# Patient Record
Sex: Male | Born: 1985 | Hispanic: No | Marital: Single | Smoking: Never smoker
Health system: Southern US, Community
[De-identification: ages and names within clinical notes are randomized; demographics above are authoritative.]

## PROBLEM LIST (undated history)

## (undated) DIAGNOSIS — R748 Abnormal levels of other serum enzymes: Secondary | ICD-10-CM

## (undated) HISTORY — PX: NO PAST SURGERIES: SHX2092

---

## 2017-01-02 ENCOUNTER — Emergency Department (HOSPITAL_COMMUNITY)
Admission: EM | Admit: 2017-01-02 | Discharge: 2017-01-02 | Disposition: A | Discharge status: Home / Self Care | Payer: Self-pay | Attending: Emergency Medicine | Admitting: Emergency Medicine

## 2017-01-02 ENCOUNTER — Encounter (HOSPITAL_COMMUNITY): Payer: Self-pay

## 2017-01-02 DIAGNOSIS — J0110 Acute frontal sinusitis, unspecified: Secondary | ICD-10-CM | POA: Insufficient documentation

## 2017-01-02 DIAGNOSIS — F17200 Nicotine dependence, unspecified, uncomplicated: Secondary | ICD-10-CM | POA: Insufficient documentation

## 2017-01-02 MED ORDER — BENZONATATE 100 MG PO CAPS: 200.0000 mg | ORAL_CAPSULE | Freq: Two times a day (BID) | ORAL | 0 refills | Status: DC | PRN

## 2017-01-02 MED ORDER — FLUTICASONE PROPIONATE 50 MCG/ACT NA SUSP: 1.0000 | Freq: Two times a day (BID) | NASAL | 2 refills | Status: DC

## 2017-01-02 NOTE — ED Triage Notes (Signed)
Pt states he has had sinus congestion X2 days. This morning he woke up with increased sinus pressure in his face. Resp e/u.

## 2017-01-02 NOTE — Discharge Instructions (Signed)
Take your medications as prescribed. Please follow up with a primary care provider from the Resource Guide provided below in 1 week as needed. Please return to the Emergency Department if symptoms worsen or new onset of fever, facial swelling, difficulty breathing, chest pain, coughing up blood, vomiting, neck stiffness, headache.

## 2017-01-02 NOTE — ED Provider Notes (Signed)
[place] DEPT Provider Note   CSN: [id number] Arrival date & time: 01/02/17  0745     History   Chief Complaint Chief Complaint  Patient presents with  . URI    HPI Glen Perry is a 31 y.o. male.  HPI   Patient is a 31 year old male with no pertinent past medical history presents the ED with complaint of nasal congestion. Patient reports over the past 2 days he has had gradually worsening nasal congestion with associated rhinorrhea, sinus pressure and nonproductive cough. Patient reports taking one dose of hydrocodone cough syrup without relief yesterday. Denies taking any other medications. Denies history of seasonal allergies. Denies fever, facial swelling, headache, visual changes, sore throat, hemoptysis, shortness of breath, chest pain, wheezing, abdominal pain, vomiting, neck pain/stiffness. Patient denies any known sick contacts.  History reviewed. No pertinent past medical history.  There are no active problems to display for this patient.   History reviewed. No pertinent surgical history.     Home Medications    Prior to Admission medications   Medication Sig Start Date End Date Taking? Authorizing Provider  benzonatate (TESSALON) 100 MG capsule Take 2 capsules (200 mg total) by mouth 2 (two) times daily as needed for cough. 01/02/17   Nona Dell, PA-C  fluticasone [facility]) 50 MCG/ACT nasal spray Place 1 spray into both nostrils 2 (two) times daily. 01/02/17   Nona Dell, PA-C    Family History History reviewed. No pertinent family history.  Social History Social History  Substance Use Topics  . Smoking status: Current Every Day Smoker    Packs/day: 0.50  . Smokeless tobacco: Never Used  . Alcohol use No     Allergies   Patient has no known allergies.   Review of Systems Review of Systems  HENT: Positive for congestion, rhinorrhea and sinus pressure.   Respiratory: Positive for cough.   All other systems  reviewed and are negative.    Physical Exam Updated Vital Signs BP 122/74 (BP Location: Right Arm)   Pulse 70   Temp 98 F (36.7 C) (Oral)   Resp 14   SpO2 97%   Physical Exam  Constitutional: He is oriented to person, place, and time. He appears well-developed and well-nourished. No distress.  HENT:  Head: Normocephalic and atraumatic.  Right Ear: Tympanic membrane normal.  Left Ear: Tympanic membrane normal.  Nose: Mucosal edema and rhinorrhea present. Right sinus exhibits frontal sinus tenderness. Right sinus exhibits no maxillary sinus tenderness. Left sinus exhibits frontal sinus tenderness. Left sinus exhibits no maxillary sinus tenderness.  Mouth/Throat: Uvula is midline, oropharynx is clear and moist and mucous membranes are normal. No oropharyngeal exudate, posterior oropharyngeal edema, posterior oropharyngeal erythema or tonsillar abscesses. No tonsillar exudate.  No facial or neck swelling  Eyes: Conjunctivae and EOM are normal. Pupils are equal, round, and reactive to light. Right eye exhibits no discharge. Left eye exhibits no discharge. No scleral icterus.  Neck: Normal range of motion. Neck supple.  Cardiovascular: Normal rate, regular rhythm, normal heart sounds and intact distal pulses.   Pulmonary/Chest: Effort normal and breath sounds normal. No respiratory distress. He has no wheezes. He has no rales. He exhibits no tenderness.  Abdominal: Soft. Bowel sounds are normal. He exhibits no distension. There is no tenderness.  Musculoskeletal: Normal range of motion. He exhibits no edema.  Lymphadenopathy:    He has no cervical adenopathy.  Neurological: He is alert and oriented to person, place, and time. He has normal strength. No  cranial nerve deficit or sensory deficit. Coordination and gait normal.  Skin: Skin is warm and dry. He is not diaphoretic.  Nursing note and vitals reviewed.    ED Treatments / Results  Labs (all labs ordered are listed, but only  abnormal results are displayed) Labs Reviewed - No data to display  EKG  EKG Interpretation None       Radiology No results found.  Procedures Procedures (including critical care time)  Medications Ordered in ED Medications - No data to display   Initial Impression / Assessment and Plan / ED Course  I have reviewed the triage vital signs and the nursing notes.  Pertinent labs & imaging results that were available during my care of the patient were reviewed by me and considered in my medical decision making (see chart for details).     Patient complaining of symptoms of sinusitis.  Mild symptoms of clear/yellow nasal discharge/congestion with nonproductive cough for less than 10 days.  Patient is afebrile.  No concern for acute bacterial rhinosinusitis; likely viral in nature.  Patient discharged with symptomatic treatment.  Patient instructions given for warm saline nasal washes.  Recommendations for follow-up with primary care physician.     Final Clinical Impressions(s) / ED Diagnoses   Final diagnoses:  Acute non-recurrent frontal sinusitis    New Prescriptions New Prescriptions   BENZONATATE (TESSALON) 100 MG CAPSULE    Take 2 capsules (200 mg total) by mouth 2 (two) times daily as needed for cough.   FLUTICASONE (FLONASE) 50 MCG/ACT NASAL SPRAY    Place 1 spray into both nostrils 2 (two) times daily.     Chesley Noon [place], [place] 01/02/17 4628    Veryl Speak, MD 01/03/17 2257

## 2017-02-23 ENCOUNTER — Emergency Department (HOSPITAL_COMMUNITY)
Admission: EM | Admit: 2017-02-23 | Discharge: 2017-02-23 | Disposition: A | Discharge status: Home / Self Care | Payer: Self-pay | Attending: Emergency Medicine | Admitting: Emergency Medicine

## 2017-02-23 ENCOUNTER — Encounter (HOSPITAL_COMMUNITY): Payer: Self-pay

## 2017-02-23 DIAGNOSIS — J3089 Other allergic rhinitis: Secondary | ICD-10-CM | POA: Insufficient documentation

## 2017-02-23 DIAGNOSIS — F17210 Nicotine dependence, cigarettes, uncomplicated: Secondary | ICD-10-CM | POA: Insufficient documentation

## 2017-02-23 DIAGNOSIS — J029 Acute pharyngitis, unspecified: Secondary | ICD-10-CM | POA: Insufficient documentation

## 2017-02-23 LAB — RAPID STREP SCREEN (MED CTR MEBANE ONLY): Streptococcus, Group A Screen (Direct): NEGATIVE

## 2017-02-23 MED ORDER — FLUTICASONE PROPIONATE 50 MCG/ACT NA SUSP: 2.0000 | Freq: Every day | NASAL | 0 refills | Status: DC

## 2017-02-23 MED ORDER — CETIRIZINE HCL 10 MG PO TABS: 10.0000 mg | ORAL_TABLET | Freq: Every day | ORAL | 1 refills | Status: DC

## 2017-02-23 MED ORDER — IBUPROFEN 400 MG PO TABS: 400.0000 mg | ORAL_TABLET | Freq: Four times a day (QID) | ORAL | 0 refills | Status: DC | PRN

## 2017-02-23 MED ORDER — IBUPROFEN 800 MG PO TABS
800.0000 mg | ORAL_TABLET | Freq: Once | ORAL | Status: AC
  Administered 2017-02-23: 800 mg via ORAL
  Filled 2017-02-23: qty 1

## 2017-02-23 NOTE — ED Triage Notes (Signed)
Patient complains of recurrent tonsillitis. Just finished amoxicillin 7 days ago for same. NAD

## 2017-02-23 NOTE — ED Provider Notes (Signed)
[place] DEPT Provider Note   CSN: [id number] Arrival date & time: 02/23/17  8315  By signing my name below, I, Marcello Moores, attest that this documentation has been prepared under the direction and in the presence of Waynetta Pean, PA-C. Electronically Signed: Marcello Moores, ED Scribe. 02/23/17. 9:50 AM.  History   Chief Complaint No chief complaint on file.  The history is provided by the patient. No language interpreter was used.   HPI Comments: Glen Perry is a 31 y.o. male who presents to the Emergency Department complaining of mild, constant sore throat with associated fatigue due to poassible recurrent tonsillitis that began 5-7 days ago. He states that he completed antibiotics (amoxicillin) 8-9 days ago (although not recorded in his chart). Pt states when he was last seen by [facility], they did not conduct a strep test. The pt denies fever, trouble swallowing, neck pain, rashes, post nasal drip, nasal congestion, rhinorrhea, and cough.   History reviewed. No pertinent past medical history.  There are no active problems to display for this patient.   History reviewed. No pertinent surgical history.     Home Medications    Prior to Admission medications   Medication Sig Start Date End Date Taking? Authorizing Provider  benzonatate (TESSALON) 100 MG capsule Take 2 capsules (200 mg total) by mouth 2 (two) times daily as needed for cough. 01/02/17   Nona Dell, PA-C  cetirizine (ZYRTEC ALLERGY) 10 MG tablet Take 1 tablet (10 mg total) by mouth daily. 02/23/17   Waynetta Pean, PA-C  fluticasone (FLONASE) 50 MCG/ACT nasal spray Place 2 sprays into both nostrils daily. 02/23/17   Waynetta Pean, PA-C  ibuprofen (ADVIL,MOTRIN) 400 MG tablet Take 1 tablet (400 mg total) by mouth every 6 (six) hours as needed. 02/23/17   Waynetta Pean, PA-C    Family History No family history on file.  Social History Social History  Substance Use  Topics  . Smoking status: Current Every Day Smoker    Packs/day: 0.50  . Smokeless tobacco: Never Used  . Alcohol use No     Allergies   Patient has no known allergies.   Review of Systems Review of Systems  Constitutional: Positive for fatigue. Negative for chills and fever.  HENT: Positive for sore throat. Negative for congestion, mouth sores, postnasal drip, trouble swallowing and voice change.   Eyes: Negative for visual disturbance.  Respiratory: Negative for cough and shortness of breath.   Gastrointestinal: Negative for abdominal pain and vomiting.  Musculoskeletal: Negative for neck pain.  Skin: Negative for rash.     Physical Exam Updated Vital Signs BP 108/67   Pulse 67   Temp 98.4 F (36.9 C) (Oral)   Resp 18   SpO2 97%   Physical Exam  Constitutional: He appears well-developed and well-nourished. No distress.  Nontoxic appearing.  HENT:  Head: Normocephalic and atraumatic.  Right Ear: External ear normal.  Left Ear: External ear normal.  Mouth/Throat: No oropharyngeal exudate.  Boggy nasal turbinates bilaterally with rhinorrhea present. Mild bilateral tonsillar hypertrophy without exudate. Uvula is midline without edema. No peritonsillar abscess. No trismus. No drooling.   Eyes: Conjunctivae are normal. Pupils are equal, round, and reactive to light. Right eye exhibits no discharge. Left eye exhibits no discharge.  Neck: Normal range of motion. Neck supple. No JVD present. No tracheal deviation present.  Cardiovascular: Normal rate, regular rhythm, normal heart sounds and intact distal pulses.   Pulmonary/Chest: Effort normal and breath sounds normal. No stridor.  No respiratory distress. He has no wheezes. He has no rales.  Abdominal: Soft. There is no tenderness.  Lymphadenopathy:    He has no cervical adenopathy.  Neurological: He is alert. Coordination normal.  Skin: Skin is warm and dry. Capillary refill takes less than 2 seconds. No rash noted. He  is not diaphoretic. No erythema. No pallor.  Psychiatric: He has a normal mood and affect. His behavior is normal.  Nursing note and vitals reviewed.    ED Treatments / Results   DIAGNOSTIC STUDIES: Oxygen Saturation is 97% on RA, adequate by my interpretation.   COORDINATION OF CARE: 9:46 AM-Discussed next steps with pt. Pt verbalized understanding and is agreeable with the plan.   Labs (all labs ordered are listed, but only abnormal results are displayed) Labs Reviewed  RAPID STREP SCREEN (NOT AT [facility])  CULTURE, GROUP A STREP [facility])    EKG  EKG Interpretation None       Radiology No results found.  Procedures Procedures (including critical care time)  Medications Ordered in ED Medications  ibuprofen (ADVIL,MOTRIN) tablet 800 mg (800 mg Oral Given 02/23/17 0956)     Initial Impression / Assessment and Plan / ED Course  I have reviewed the triage vital signs and the nursing notes.  Pertinent labs & imaging results that were available during my care of the patient were reviewed by me and considered in my medical decision making (see chart for details).     This  is a 31 y.o. male who presents to the Emergency Department complaining of mild, constant sore throat with associated fatigue due to poassible recurrent tonsillitis that began 5-7 days ago. He states that he completed antibiotics (amoxicillin) 8-9 days ago (although not recorded in his chart). Pt states when he was last seen by [facility], they did not conduct a strep test. The pt denies fever, trouble swallowing.  On exam the patient is afebrile nontoxic appearing. He has mild bilateral tonsillar hypertrophy without exudates. Uvula is midline without edema. Rhinorrhea and boggy nasal terminates are present bilaterally. Rapid strep is negative. Patient with viral sore throat and allergic rhinitis. Will start on Flonase, Zyrtec and ibuprofen. Encouraged lots of clear fluids. I encouraged close follow-up by  primary care. I discussed return precautions. I advised the patient to follow-up with their primary care provider this week. I advised the patient to return to the emergency department with new or worsening symptoms or new concerns. The patient verbalized understanding and agreement with plan.      Final Clinical Impressions(s) / ED Diagnoses   Final diagnoses:  Viral pharyngitis  Seasonal allergic rhinitis due to other allergic trigger    New Prescriptions New Prescriptions   CETIRIZINE (ZYRTEC ALLERGY) 10 MG TABLET    Take 1 tablet (10 mg total) by mouth daily.   FLUTICASONE (FLONASE) 50 MCG/ACT NASAL SPRAY    Place 2 sprays into both nostrils daily.   IBUPROFEN (ADVIL,MOTRIN) 400 MG TABLET    Take 1 tablet (400 mg total) by mouth every 6 (six) hours as needed.   I personally performed the services described in this documentation, which was scribed in my presence. The recorded information has been reviewed and is accurate.       Waynetta Pean, PA-C 02/23/17 1025    Tanna Furry, MD 02/27/17 [phone]

## 2017-02-25 LAB — CULTURE, GROUP A STREP (THRC)

## 2017-04-22 ENCOUNTER — Emergency Department (HOSPITAL_COMMUNITY)
Admission: EM | Admit: 2017-04-22 | Discharge: 2017-04-22 | Disposition: A | Discharge status: Home / Self Care | Payer: Self-pay | Attending: Emergency Medicine | Admitting: Emergency Medicine

## 2017-04-22 ENCOUNTER — Emergency Department (HOSPITAL_COMMUNITY): Payer: Self-pay

## 2017-04-22 ENCOUNTER — Encounter (HOSPITAL_COMMUNITY): Payer: Self-pay | Admitting: Emergency Medicine

## 2017-04-22 DIAGNOSIS — K4020 Bilateral inguinal hernia, without obstruction or gangrene, not specified as recurrent: Secondary | ICD-10-CM | POA: Insufficient documentation

## 2017-04-22 DIAGNOSIS — K4021 Bilateral inguinal hernia, without obstruction or gangrene, recurrent: Secondary | ICD-10-CM

## 2017-04-22 DIAGNOSIS — I499 Cardiac arrhythmia, unspecified: Secondary | ICD-10-CM | POA: Insufficient documentation

## 2017-04-22 DIAGNOSIS — F17200 Nicotine dependence, unspecified, uncomplicated: Secondary | ICD-10-CM | POA: Insufficient documentation

## 2017-04-22 LAB — URINALYSIS, ROUTINE W REFLEX MICROSCOPIC
BILIRUBIN URINE: NEGATIVE
Glucose, UA: NEGATIVE mg/dL
Hgb urine dipstick: NEGATIVE
KETONES UR: NEGATIVE mg/dL
Leukocytes, UA: NEGATIVE
NITRITE: NEGATIVE
PROTEIN: NEGATIVE mg/dL
Specific Gravity, Urine: 1.006 (ref 1.005–1.030)
pH: 8 (ref 5.0–8.0)

## 2017-04-22 LAB — CBC
HCT: 46.5 % (ref 39.0–52.0)
HEMOGLOBIN: 15.7 g/dL (ref 13.0–17.0)
MCH: 28.9 pg (ref 26.0–34.0)
MCHC: 33.8 g/dL (ref 30.0–36.0)
MCV: 85.5 fL (ref 78.0–100.0)
Platelets: 135 10*3/uL — ABNORMAL LOW (ref 150–400)
RBC: 5.44 MIL/uL (ref 4.22–5.81)
RDW: 12.7 % (ref 11.5–15.5)
WBC: 6 10*3/uL (ref 4.0–10.5)

## 2017-04-22 LAB — BASIC METABOLIC PANEL
ANION GAP: 12 (ref 5–15)
BUN: 8 mg/dL (ref 6–20)
CALCIUM: 8.9 mg/dL (ref 8.9–10.3)
CO2: 20 mmol/L — AB (ref 22–32)
Chloride: 104 mmol/L (ref 101–111)
Creatinine, Ser: 1.26 mg/dL — ABNORMAL HIGH (ref 0.61–1.24)
GFR calc Af Amer: >60 mL/min (ref >60)
Glucose, Bld: 106 mg/dL — ABNORMAL HIGH (ref 65–99)
POTASSIUM: 4.5 mmol/L (ref 3.5–5.1)
Sodium: 136 mmol/L (ref 135–145)

## 2017-04-22 LAB — LIPASE, BLOOD: LIPASE: 29 U/L (ref 11–51)

## 2017-04-22 LAB — I-STAT CG4 LACTIC ACID, ED
LACTIC ACID, VENOUS: 1.05 mmol/L (ref 0.5–1.9)
LACTIC ACID, VENOUS: 0.95 mmol/L (ref 0.5–1.9)

## 2017-04-22 LAB — I-STAT TROPONIN, ED: TROPONIN I, POC: 0 ng/mL (ref 0.00–0.08)

## 2017-04-22 MED ORDER — IOPAMIDOL (ISOVUE-300) INJECTION 61%
INTRAVENOUS | Status: AC
  Administered 2017-04-22: 100 mL
  Filled 2017-04-22: qty 100

## 2017-04-22 MED ORDER — SODIUM CHLORIDE 0.9 % IV BOLUS (SEPSIS)
1000.0000 mL | Freq: Once | INTRAVENOUS | Status: AC
Start: 2017-04-22 — End: 2017-04-22
  Administered 2017-04-22: 1000 mL via INTRAVENOUS

## 2017-04-22 NOTE — ED Notes (Signed)
Patient transported to CT 

## 2017-04-22 NOTE — ED Triage Notes (Addendum)
Pt reports 4 days ago he began having centralized cramping to middle of abdomen. Pt also states he uses an app on his phone and noticed his heart rate was 105. Pt states he will be lying down and feel his heart pounding in his chest. PT states it was 106 while donating plasma. Denies any nausea vomiting and diarrhea with the abdominal cramping. Pt also reports trouble sleeping, only sleeping 2-3 hours at a time. Also reports headaches. Denies fevers, shortness of breath or chest pain.Pt states his heart rate is normally 60.

## 2017-04-22 NOTE — Discharge Instructions (Signed)
Take tylenol, motrin for pain.   You have hernias so you should call surgery for appointment for treatment options  Stay hydrated   Return to ER if you have fever, vomiting, severe abdominal pain or distention, severe pain at the hernia site or hernia not pushing back in

## 2017-04-22 NOTE — ED Notes (Signed)
Pt called to be roomed with no answer.  

## 2017-04-22 NOTE — ED Notes (Signed)
ED Provider at bedside. 

## 2017-04-22 NOTE — ED Provider Notes (Signed)
[place] DEPT Provider Note   CSN: [id number] Arrival date & time: 04/22/17  0316     History   Chief Complaint Chief Complaint  Patient presents with  . Irregular Heart Beat    HPI Glen Perry is a 31 y.o. male otherwise healthy here with headaches, abdominal pain. Patient has been having intermittent headaches for the last 4 days. No associated vomiting or blurry vision or weakness. Went to donate plasma several days ago and was noted to be tachycardic to 105 so didn't donate plasma. Over the last several days as well, patient has been having umbilical pain that is causing him to be nauseated but denies any vomiting. The pain has not gone away and denies any urinary symptoms. Patient states that he is a little constipated and last bowel movement was yesterday he tried MiraLAX with no relief. Denies any previous abdominal surgeries.   The history is provided by the patient.    History reviewed. No pertinent past medical history.  There are no active problems to display for this patient.   No past surgical history on file.     Home Medications    Prior to Admission medications   Medication Sig Start Date End Date Taking? Authorizing Provider  benzonatate (TESSALON) 100 MG capsule Take 2 capsules (200 mg total) by mouth 2 (two) times daily as needed for cough. Patient not taking: Reported on 04/22/2017 01/02/17   Nona Dell, PA-C  cetirizine (ZYRTEC ALLERGY) 10 MG tablet Take 1 tablet (10 mg total) by mouth daily. Patient not taking: Reported on 04/22/2017 02/23/17   Waynetta Pean, PA-C  fluticasone [facility]) 50 MCG/ACT nasal spray Place 2 sprays into both nostrils daily. Patient not taking: Reported on 04/22/2017 02/23/17   Waynetta Pean, PA-C  ibuprofen (ADVIL,MOTRIN) 400 MG tablet Take 1 tablet (400 mg total) by mouth every 6 (six) hours as needed. Patient not taking: Reported on 04/22/2017 02/23/17   Waynetta Pean, PA-C    Family  History No family history on file.  Social History Social History  Substance Use Topics  . Smoking status: Current Every Day Smoker    Packs/day: 0.50  . Smokeless tobacco: Never Used  . Alcohol use No     Allergies   Patient has no known allergies.   Review of Systems Review of Systems  Gastrointestinal: Positive for abdominal pain.  Neurological: Positive for headaches.  All other systems reviewed and are negative.    Physical Exam Updated Vital Signs BP 109/68 (BP Location: Left Arm)   Pulse 67   Temp 99.4 F (37.4 C) (Oral)   Resp 20   SpO2 98%   Physical Exam  Constitutional: He is oriented to person, place, and time.  Slightly uncomfortable   HENT:  Head: Normocephalic.  Eyes: Pupils are equal, round, and reactive to light. Conjunctivae and EOM are normal.  Neck: Normal range of motion. Neck supple.  Cardiovascular: Normal rate, regular rhythm and normal heart sounds.   Pulmonary/Chest: Effort normal and breath sounds normal. No respiratory distress. He has no wheezes.  Abdominal: Soft. Bowel sounds are normal.  Mild periumbilical tenderness, no RLQ or LLQ tenderness   Musculoskeletal: Normal range of motion.  Neurological: He is alert and oriented to person, place, and time. He displays normal reflexes. No cranial nerve deficit. Coordination normal.  CN 2-12 intact. Nl strength throughout. Nl sensation throughout   Skin: Skin is warm.  Psychiatric: He has a normal mood and affect.  Nursing note and vitals reviewed.  ED Treatments / Results  Labs (all labs ordered are listed, but only abnormal results are displayed) Labs Reviewed  BASIC METABOLIC PANEL - Abnormal; Notable for the following:       Result Value   CO2 20 (*)    Glucose, Bld 106 (*)    Creatinine, Ser 1.26 (*)    All other components within normal limits  CBC - Abnormal; Notable for the following:    Platelets 135 (*)    All other components within normal limits  LIPASE, BLOOD   URINALYSIS, ROUTINE W REFLEX MICROSCOPIC  I-STAT TROPONIN, ED  I-STAT CG4 LACTIC ACID, ED  I-STAT CG4 LACTIC ACID, ED    EKG  EKG Interpretation  Date/Time:  Saturday April 22 2017 03:16:40 EDT Ventricular Rate:  90 PR Interval:  144 QRS Duration: 88 QT Interval:  356 QTC Calculation: 435 R Axis:   99 Text Interpretation:  Normal sinus rhythm Rightward axis Borderline ECG No previous ECGs available Confirmed by Wandra Arthurs [phone]) on 04/22/2017 7:05:54 AM       Radiology Dg Chest 2 View  Result Date: 04/22/2017 CLINICAL DATA:  Acute onset of central abdominal cramping and tachycardia. Headaches. Initial encounter. EXAM: CHEST  2 VIEW COMPARISON:  None. FINDINGS: The lungs are well-aerated and clear. There is no evidence of focal opacification, pleural effusion or pneumothorax. The heart is normal in size; the mediastinal contour is within normal limits. No acute osseous abnormalities are seen. IMPRESSION: No acute cardiopulmonary process seen. Electronically Signed   By: Garald Balding M.D.   On: 04/22/2017 03:44   Ct Abdomen Pelvis W Contrast  Result Date: 04/22/2017 CLINICAL DATA:  Epigastric pain for several days EXAM: CT ABDOMEN AND PELVIS WITH CONTRAST TECHNIQUE: Multidetector CT imaging of the abdomen and pelvis was performed using the standard protocol following bolus administration of intravenous contrast. CONTRAST:  123mL ISOVUE-300 IOPAMIDOL (ISOVUE-300) INJECTION 61% COMPARISON:  None. FINDINGS: Lower chest: No acute abnormality. Hepatobiliary: No focal liver abnormality is seen. No gallstones, gallbladder wall thickening, or biliary dilatation. Pancreas: Unremarkable. No pancreatic ductal dilatation or surrounding inflammatory changes. Spleen: Normal in size without focal abnormality. Adrenals/Urinary Tract: Bladder is well distended. Kidneys are well visualized bilaterally. No renal calculi or obstructive changes are seen. Stomach/Bowel: Stomach is within normal limits.  Appendix appears normal. No evidence of bowel wall thickening, distention, or inflammatory changes. Vascular/Lymphatic: No significant vascular findings are present. No enlarged abdominal or pelvic lymph nodes. Reproductive: Prostate is unremarkable. Other: Small fluid containing right inguinal hernia is noted. Small fat containing left inguinal hernia is seen. Musculoskeletal: No acute or significant osseous findings. IMPRESSION: Bilateral small inguinal hernias. Fluid is noted within the right inguinal hernia. No other focal abnormality is seen. Electronically Signed   By: Inez Catalina M.D.   On: 04/22/2017 09:29    Procedures Procedures (including critical care time)  Medications Ordered in ED Medications  sodium chloride 0.9 % bolus 1,000 mL (1,000 mLs Intravenous New Bag/Given 04/22/17 0736)  iopamidol (ISOVUE-300) 61 % injection (100 mLs  Contrast Given 04/22/17 0856)     Initial Impression / Assessment and Plan / ED Course  I have reviewed the triage vital signs and the nursing notes.  Pertinent labs & imaging results that were available during my care of the patient were reviewed by me and considered in my medical decision making (see chart for details).     Glen Perry is a 31 y.o. male here with abdominal pain, dizziness, headaches. Nl neuro exam,  headaches improved. Still has mild periumbilical tenderness. Likely mild gastro vs appendicitis. HR down to 80s in the ED. No chest pain or SOB to suggest PE. Will get labs, CT ab/pel, UA. Will hydrate and reassess.   9:41 AM  Bicarb 20, likely from dehydration. CT ab/pel showed bilateral inguinal hernia. He states that he has hx of this and I palpated the hernias and they are reducible and soft. No signs of SBO, nl appendix. More likely gastro and inguinal hernias incidental. Will dc home with motrin, tylenol, surgery follow up outpatient.    Final Clinical Impressions(s) / ED Diagnoses   Final diagnoses:  None    New  Prescriptions New Prescriptions   No medications on file     Drenda Freeze, MD 04/22/17 [phone]

## 2017-05-17 ENCOUNTER — Emergency Department (HOSPITAL_COMMUNITY)
Admission: EM | Admit: 2017-05-17 | Discharge: 2017-05-17 | Disposition: A | Discharge status: Home / Self Care | Payer: Self-pay | Attending: Emergency Medicine | Admitting: Emergency Medicine

## 2017-05-17 ENCOUNTER — Encounter (HOSPITAL_COMMUNITY): Payer: Self-pay | Admitting: *Deleted

## 2017-05-17 DIAGNOSIS — M542 Cervicalgia: Secondary | ICD-10-CM | POA: Insufficient documentation

## 2017-05-17 DIAGNOSIS — F17200 Nicotine dependence, unspecified, uncomplicated: Secondary | ICD-10-CM | POA: Insufficient documentation

## 2017-05-17 MED ORDER — CYCLOBENZAPRINE HCL 10 MG PO TABS: 10.0000 mg | ORAL_TABLET | Freq: Two times a day (BID) | ORAL | 0 refills | Status: DC | PRN

## 2017-05-17 MED ORDER — IBUPROFEN 600 MG PO TABS: 600.0000 mg | ORAL_TABLET | Freq: Four times a day (QID) | ORAL | 0 refills | Status: DC | PRN

## 2017-05-17 NOTE — Discharge Instructions (Signed)
Take Ibuprofen three times daily for the next week. Take this medicine with food. Take muscle relaxer at bedtime to help you sleep. This medicine makes you drowsy so do not take before driving or work Use a heating pad for sore muscles - use for 20 minutes several times a day Return for worsening symptoms

## 2017-05-17 NOTE — ED Provider Notes (Signed)
[facility] DEPT Provider Note   CSN: [id number] Arrival date & time: 05/17/17  1048     History   Chief Complaint Chief Complaint  Patient presents with  . Neck Pain    HPI Glen Perry is a 31 y.o. male who presents with right-sided neck pain. About 3 weeks ago he rode a roller coaster and immediately afterwards felt pain over the right side of his neck. It is constant and worse with certain movements. It is worse in the morning and feels stiff when he wakes up. He has been taking ibuprofen with minimal relief. He denies any arm or leg weakness, numbness or tingling. Bowel or bladder incontinence. He states that he has chronic pain mid back pain from scoliosis but does not typically have neck pain.  HPI  History reviewed. No pertinent past medical history.  There are no active problems to display for this patient.   History reviewed. No pertinent surgical history.     Home Medications    Prior to Admission medications   Medication Sig Start Date End Date Taking? Authorizing Provider  benzonatate (TESSALON) 100 MG capsule Take 2 capsules (200 mg total) by mouth 2 (two) times daily as needed for cough. Patient not taking: Reported on 04/22/2017 01/02/17   Nona Dell, PA-C  cetirizine (ZYRTEC ALLERGY) 10 MG tablet Take 1 tablet (10 mg total) by mouth daily. Patient not taking: Reported on 04/22/2017 02/23/17   Waynetta Pean, PA-C  fluticasone [facility]) 50 MCG/ACT nasal spray Place 2 sprays into both nostrils daily. Patient not taking: Reported on 04/22/2017 02/23/17   Waynetta Pean, PA-C  ibuprofen (ADVIL,MOTRIN) 400 MG tablet Take 1 tablet (400 mg total) by mouth every 6 (six) hours as needed. Patient not taking: Reported on 04/22/2017 02/23/17   Waynetta Pean, PA-C    Family History History reviewed. No pertinent family history.  Social History Social History  Substance Use Topics  . Smoking status: Current Every Day Smoker    Packs/day:  0.50  . Smokeless tobacco: Never Used  . Alcohol use No     Allergies   Patient has no known allergies.   Review of Systems Review of Systems  Musculoskeletal: Positive for myalgias, neck pain and neck stiffness. Negative for back pain and gait problem.  Neurological: Negative for weakness and numbness.     Physical Exam Updated Vital Signs BP 111/79 (BP Location: Left Arm)   Pulse (!) 50   Temp 97.8 F (36.6 C) (Oral)   Resp 18   SpO2 100%   Physical Exam  Constitutional: He is oriented to person, place, and time. He appears well-developed and well-nourished. No distress.  HENT:  Head: Normocephalic and atraumatic.  Eyes: Pupils are equal, round, and reactive to light. Conjunctivae are normal. Right eye exhibits no discharge. Left eye exhibits no discharge. No scleral icterus.  Neck: Normal range of motion. Neck supple.  No midline tenderness. Tenderness over the right side of the neck and trapezius  Cardiovascular: Normal rate.   Pulmonary/Chest: Effort normal. No respiratory distress.  Abdominal: He exhibits no distension.  Neurological: He is alert and oriented to person, place, and time.  GCS 15. Speaks in a clear voice. Cranial nerves II through XII grossly intact. 5/5 strength in all extremities. Sensation fully intact.  Bilateral finger-to-nose intact. Ambulatory    Skin: Skin is warm and dry.  Psychiatric: He has a normal mood and affect. His behavior is normal.  Nursing note and vitals reviewed.    ED  Treatments / Results  Labs (all labs ordered are listed, but only abnormal results are displayed) Labs Reviewed - No data to display  EKG  EKG Interpretation None       Radiology No results found.  Procedures Procedures (including critical care time)  Medications Ordered in ED Medications - No data to display   Initial Impression / Assessment and Plan / ED Course  I have reviewed the triage vital signs and the nursing notes.  Pertinent labs  & imaging results that were available during my care of the patient were reviewed by me and considered in my medical decision making (see chart for details).  31 year old male presents with right sided neck pain. Suspect ongoing strain. He has no midline tenderness and normal strength. Will treat conservatively with NSAIDs, muscle relaxer, heat and advised return for worsening symptoms.  Final Clinical Impressions(s) / ED Diagnoses   Final diagnoses:  Neck pain    New Prescriptions New Prescriptions   No medications on file     Iris Pert 05/17/17 1406    Nat Christen, MD 05/20/17 [phone]

## 2017-05-17 NOTE — ED Triage Notes (Signed)
Pt reports having neck pain since riding a roller coaster 3 weeks ago. Ambulatory at triage and no acute distress noted.

## 2017-05-17 NOTE — ED Notes (Signed)
See EDP secondary assessment.  

## 2018-09-17 ENCOUNTER — Emergency Department (HOSPITAL_COMMUNITY)
Admission: EM | Admit: 2018-09-17 | Discharge: 2018-09-17 | Disposition: A | Discharge status: Home / Self Care | Payer: Self-pay | Attending: Emergency Medicine | Admitting: Emergency Medicine

## 2018-09-17 ENCOUNTER — Encounter (HOSPITAL_COMMUNITY): Payer: Self-pay | Admitting: *Deleted

## 2018-09-17 DIAGNOSIS — J069 Acute upper respiratory infection, unspecified: Secondary | ICD-10-CM | POA: Insufficient documentation

## 2018-09-17 DIAGNOSIS — F17210 Nicotine dependence, cigarettes, uncomplicated: Secondary | ICD-10-CM | POA: Insufficient documentation

## 2018-09-17 DIAGNOSIS — B9789 Other viral agents as the cause of diseases classified elsewhere: Secondary | ICD-10-CM

## 2018-09-17 MED ORDER — DM-GUAIFENESIN ER 60-1200 MG PO TB12: 1.0000 | ORAL_TABLET | Freq: Two times a day (BID) | ORAL | 0 refills | Status: AC

## 2018-09-17 MED ORDER — FLUTICASONE PROPIONATE 50 MCG/ACT NA SUSP: 1.0000 | Freq: Every day | NASAL | 1 refills | Status: DC

## 2018-09-17 NOTE — Discharge Instructions (Signed)
Please read and follow all provided instructions.  Your diagnoses today include:  1. Viral URI with cough     You appear to have an upper respiratory infection (URI). An upper respiratory tract infection, or cold, is a viral infection of the air passages leading to the lungs. It should improve gradually after 5-7 days. You may have a lingering cough that lasts for 2- 4 weeks after the infection.  Tests performed today include: Vital signs. See below for your results today.   Medications prescribed:   Take any prescribed medications only as directed. Treatment for your infection is aimed at treating the symptoms. There are no medications, such as antibiotics, that will cure your infection.   Home care instructions:  Follow any educational materials contained in this packet.   Your illness is contagious and can be spread to others, especially during the first 3 or 4 days. It cannot be cured by antibiotics or other medicines. Take basic precautions such as washing your hands often, covering your mouth when you cough or sneeze, and avoiding public places where you could spread your illness to others.   Please continue drinking plenty of fluids.  Use over-the-counter medicines as needed as directed on packaging for symptom relief.  You may also use ibuprofen or tylenol as directed on packaging for pain or fever.  Do not take multiple medicines containing Tylenol or acetaminophen to avoid taking too much of this medication.  Follow-up instructions: Please follow-up with your primary care provider in the next 3 days for further evaluation of your symptoms if you are not feeling better.   Return instructions:  Please return to the Emergency Department if you experience worsening symptoms.  RETURN IMMEDIATELY IF you develop shortness of breath, chest pain, confusion or altered mental status, a new rash, become dizzy, faint, or poorly responsive, or are unable to be cared for at home. Please return  if you have persistent vomiting and cannot keep down fluids or develop a fever that is not controlled by tylenol or motrin.   Please return if you have any other emergent concerns.  Additional Information:  Your vital signs today were: BP (!) 125/94 (BP Location: Right Arm)    Pulse (!) 51    Temp 98 F (36.7 C) (Oral)    Resp 18    SpO2 98%  If your blood pressure (BP) was elevated above 135/85 this visit, please have this repeated by your doctor within one month. --------------

## 2018-09-17 NOTE — ED Triage Notes (Signed)
Pt in c/o cough and congestion for the last week, sore throat as well, denies fever, no distress noted

## 2018-09-17 NOTE — ED Provider Notes (Signed)
[facility] EMERGENCY DEPARTMENT Provider Note   CSN: [id number] Arrival date & time: 09/17/18  1019     History   Chief Complaint Chief Complaint  Patient presents with  . URI    HPI Glen Perry is a 33 y.o. male.  HPI  Patient is a 33 year old male with no significant past medical history presenting for cough, nasal congestion, and rhinorrhea.  Patient reports that he missed 2 days of work due to this and needs a work note.  Patient reports that his symptoms are improving, however he continues to have a sore throat in the mornings, rhinorrhea in the mornings, as well as a nonproductive cough.  He denies any fever or chills over the interval of this illness over the past week.  Patient denies any otalgia.  Patient denies any history of asthma or immune compromise status.  History reviewed. No pertinent past medical history.  There are no active problems to display for this patient.   History reviewed. No pertinent surgical history.      Home Medications    Prior to Admission medications   Medication Sig Start Date End Date Taking? Authorizing Provider  cyclobenzaprine (FLEXERIL) 10 MG tablet Take 1 tablet (10 mg total) by mouth 2 (two) times daily as needed for muscle spasms. 05/17/17   Recardo Evangelist, PA-C  ibuprofen (ADVIL,MOTRIN) 600 MG tablet Take 1 tablet (600 mg total) by mouth every 6 (six) hours as needed. 05/17/17   Recardo Evangelist, PA-C    Family History History reviewed. No pertinent family history.  Social History Social History   Tobacco Use  . Smoking status: Current Every Day Smoker    Packs/day: 0.50  . Smokeless tobacco: Never Used  Substance Use Topics  . Alcohol use: No  . Drug use: Not on file     Allergies   Patient has no known allergies.   Review of Systems Review of Systems  Constitutional: Negative for chills and fever.  HENT: Positive for congestion, postnasal drip and rhinorrhea. Negative for  ear pain and sore throat.   Respiratory: Positive for cough.   Gastrointestinal: Negative for diarrhea, nausea and vomiting.     Physical Exam Updated Vital Signs BP (!) 125/94 (BP Location: Right Arm)   Pulse (!) 51   Temp 98 F (36.7 C) (Oral)   Resp 18   SpO2 98%   Physical Exam Vitals signs and nursing note reviewed.  Constitutional:      General: He is not in acute distress.    Appearance: He is well-developed. He is not diaphoretic.     Comments: Sitting comfortably in bed.  HENT:     Head: Normocephalic and atraumatic.     Mouth/Throat:     Mouth: Mucous membranes are moist.     Pharynx: No oropharyngeal exudate or posterior oropharyngeal erythema.  Eyes:     General:        Right eye: No discharge.        Left eye: No discharge.     Conjunctiva/sclera: Conjunctivae normal.     Comments: EOMs normal to gross examination.  Neck:     Musculoskeletal: Normal range of motion.  Cardiovascular:     Rate and Rhythm: Regular rhythm.     Heart sounds: Normal heart sounds. No murmur.     Comments: Bradycardia noted.  Pulmonary:     Effort: Pulmonary effort is normal.     Breath sounds: Normal breath sounds. No wheezing, rhonchi or  rales.  Abdominal:     General: There is no distension.  Musculoskeletal: Normal range of motion.  Skin:    General: Skin is warm and dry.  Neurological:     Mental Status: He is alert.     Comments: Cranial nerves intact to gross observation. Patient moves extremities without difficulty.  Psychiatric:        Behavior: Behavior normal.        Thought Content: Thought content normal.        Judgment: Judgment normal.      ED Treatments / Results  Labs (all labs ordered are listed, but only abnormal results are displayed) Labs Reviewed - No data to display  EKG None  Radiology No results found.  Procedures Procedures (including critical care time)  Medications Ordered in ED Medications - No data to display   Initial  Impression / Assessment and Plan / ED Course  I have reviewed the triage vital signs and the nursing notes.  Pertinent labs & imaging results that were available during my care of the patient were reviewed by me and considered in my medical decision making (see chart for details).     Patient nontoxic-appearing, afebrile, no acute distress.  Patient with symptoms consistent with a viral syndrome. Vitals are stable, no fever. No signs of dehydration. Lung exam normal, no signs of pneumonia. Supportive therapy indicated with return if symptoms worsen.    Pulse was noted to be 51.  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 Asymptomatic.   Final Clinical Impressions(s) / ED Diagnoses   Final diagnoses:  Viral URI with cough    ED Discharge Orders         Ordered    fluticasone (FLONASE) 50 MCG/ACT nasal spray  Daily     09/17/18 1152    Dextromethorphan-Guaifenesin 60-1200 MG 12hr tablet  Every 12 hours     09/17/18 1152           Albesa Seen, [place] 09/17/18 1153    Maudie Flakes, MD 09/17/18 1502

## 2018-11-08 ENCOUNTER — Emergency Department (HOSPITAL_COMMUNITY)
Admission: EM | Admit: 2018-11-08 | Discharge: 2018-11-08 | Disposition: A | Discharge status: Home / Self Care | Payer: Self-pay | Attending: Emergency Medicine | Admitting: Emergency Medicine

## 2018-11-08 ENCOUNTER — Emergency Department (HOSPITAL_COMMUNITY): Payer: Self-pay

## 2018-11-08 ENCOUNTER — Encounter (HOSPITAL_COMMUNITY): Payer: Self-pay | Admitting: Emergency Medicine

## 2018-11-08 DIAGNOSIS — S6991XA Unspecified injury of right wrist, hand and finger(s), initial encounter: Secondary | ICD-10-CM | POA: Insufficient documentation

## 2018-11-08 DIAGNOSIS — W208XXA Other cause of strike by thrown, projected or falling object, initial encounter: Secondary | ICD-10-CM | POA: Insufficient documentation

## 2018-11-08 DIAGNOSIS — Z87891 Personal history of nicotine dependence: Secondary | ICD-10-CM | POA: Insufficient documentation

## 2018-11-08 DIAGNOSIS — Y9289 Other specified places as the place of occurrence of the external cause: Secondary | ICD-10-CM | POA: Insufficient documentation

## 2018-11-08 DIAGNOSIS — Y990 Civilian activity done for income or pay: Secondary | ICD-10-CM | POA: Insufficient documentation

## 2018-11-08 DIAGNOSIS — Y9389 Activity, other specified: Secondary | ICD-10-CM | POA: Insufficient documentation

## 2018-11-08 NOTE — Discharge Instructions (Addendum)
You xray today showed no fracture or dislocation. You may alternate ibuprofen of tylenol for the pain. Please follow up with PCP as needed.  If symptoms not improve her pain continues for several weeks you may benefit from an appointment with a hand specialist, the number to Dr. Apolonio Schneiders has been added to your paperwork.

## 2018-11-08 NOTE — ED Triage Notes (Signed)
Pt states he hit his right thumb with a hammer drill yesterday. No broken skin. Pt states it "clicks" when he moves hs thumb at the joint. Slight swelling noted. No obvious deformities.

## 2018-11-08 NOTE — ED Provider Notes (Signed)
[facility] EMERGENCY DEPARTMENT Provider Note   CSN: [id number] Arrival date & time: 11/08/18  0806    History   Chief Complaint Chief Complaint  Patient presents with  . Finger Injury    HPI Glen Perry is a 33 y.o. male.     33 y.o male with no PMH presents to the ED with a chief complaint of right thumb pain x 5 days. Patient reports he was working with a drill when he pushed the drill onto the concrete and it pushed back on his right thumb. He reports feeling a "clicking" sensation to the right thumb worse with movement and better at rest. He has taken ibuprofen with relieve in symptoms. He denies any other complaint or breakage in the skin.        Home Medications    Prior to Admission medications   Medication Sig Start Date End Date Taking? Authorizing Provider  cyclobenzaprine (FLEXERIL) 10 MG tablet Take 1 tablet (10 mg total) by mouth 2 (two) times daily as needed for muscle spasms. 05/17/17   Recardo Evangelist, PA-C  fluticasone (FLONASE) 50 MCG/ACT nasal spray Place 1 spray into both nostrils daily. 09/17/18   Langston Masker B, PA-C  ibuprofen (ADVIL,MOTRIN) 600 MG tablet Take 1 tablet (600 mg total) by mouth every 6 (six) hours as needed. 05/17/17   Recardo Evangelist, PA-C    Family History History reviewed. No pertinent family history.  Social History Social History   Tobacco Use  . Smoking status: Former Smoker    Packs/day: 0.00  . Smokeless tobacco: Never Used  Substance Use Topics  . Alcohol use: No    Frequency: Never  . Drug use: Not Currently     Allergies   Patient has no known allergies.   Review of Systems Review of Systems  Constitutional: Negative for fever.  Musculoskeletal: Positive for arthralgias.     Physical Exam Updated Vital Signs BP 104/86 (BP Location: Right Arm)   Pulse 90   Temp 98.6 F (37 C) (Oral)   Resp 20   Ht 5\' 11"  (1.803 m)   Wt 81.6 kg   SpO2 99%   BMI 25.10 kg/m    Physical Exam Vitals signs and nursing note reviewed.  Constitutional:      Appearance: He is well-developed.  HENT:     Head: Normocephalic and atraumatic.  Eyes:     General: No scleral icterus.    Pupils: Pupils are equal, round, and reactive to light.  Neck:     Musculoskeletal: Normal range of motion.  Cardiovascular:     Heart sounds: Normal heart sounds.  Pulmonary:     Effort: Pulmonary effort is normal.     Breath sounds: Normal breath sounds. No wheezing.  Chest:     Chest wall: No tenderness.  Abdominal:     General: Bowel sounds are normal. There is no distension.     Palpations: Abdomen is soft.     Tenderness: There is no abdominal tenderness.  Musculoskeletal:        General: No tenderness or deformity.     Right hand: He exhibits normal range of motion and no tenderness. Normal sensation noted. Normal strength noted.     Comments: No swelling, obvious deformity, breakage in the skin.  Pulses present, capillary refill is intact.  Mild tenderness at the base of the right thumb.  Skin:    General: Skin is warm and dry.  Neurological:  Mental Status: He is alert and oriented to person, place, and time.      ED Treatments / Results  Labs (all labs ordered are listed, but only abnormal results are displayed) Labs Reviewed - No data to display  EKG None  Radiology Dg Hand 2 View Right  Result Date: 11/08/2018 CLINICAL DATA:  Trauma to the right thumb yesterday with pain. EXAM: RIGHT HAND - 2 VIEW COMPARISON:  None. FINDINGS: There is linear lucency of the first proximal phalanx. If the patient has pain here, a nondisplaced fracture is not excluded. There is no dislocation. IMPRESSION: Linear lucency of the first proximal phalanx. The patient has pain here, nondisplaced fracture is not excluded. Electronically Signed   By: Abelardo Diesel M.D.   On: 11/08/2018 09:11    Procedures Procedures (including critical care time)  Medications Ordered in  ED Medications - No data to display   Initial Impression / Assessment and Plan / ED Course  I have reviewed the triage vital signs and the nursing notes.  Pertinent labs & imaging results that were available during my care of the patient were reviewed by me and considered in my medical decision making (see chart for details).      Patient presents to the ED with an injury at work of his right thumb while using the drill hammer, x-ray showed a linear lucency of the first proximal phalanx where patient is having pain.  They cannot exclude fracture at this time patient will be placed on immobilizer and follow-up with orthopedist if pain does not improve.  Patient pulses are present, capillary refill is intact.  Agrees with management.  Return precautions provided at length.   Final Clinical Impressions(s) / ED Diagnoses   Final diagnoses:  Injury of finger of right hand, initial encounter    ED Discharge Orders    None       Janeece Fitting, PA-C 11/08/18 [id number]    Jola Schmidt, MD 11/09/18 [phone]

## 2019-09-19 ENCOUNTER — Ambulatory Visit: Payer: Self-pay | Attending: Internal Medicine

## 2019-09-19 DIAGNOSIS — Z20822 Contact with and (suspected) exposure to covid-19: Secondary | ICD-10-CM | POA: Insufficient documentation

## 2019-09-21 LAB — NOVEL CORONAVIRUS, NAA: SARS-CoV-2, NAA: NOT DETECTED

## 2019-09-22 ENCOUNTER — Encounter (HOSPITAL_COMMUNITY): Payer: Self-pay | Admitting: Emergency Medicine

## 2019-09-22 ENCOUNTER — Other Ambulatory Visit: Payer: Self-pay

## 2019-09-22 ENCOUNTER — Emergency Department (HOSPITAL_COMMUNITY): Payer: Self-pay

## 2019-09-22 ENCOUNTER — Emergency Department (HOSPITAL_COMMUNITY)
Admission: EM | Admit: 2019-09-22 | Discharge: 2019-09-22 | Disposition: A | Discharge status: Home / Self Care | Payer: Self-pay | Attending: Emergency Medicine | Admitting: Emergency Medicine

## 2019-09-22 DIAGNOSIS — Z79899 Other long term (current) drug therapy: Secondary | ICD-10-CM | POA: Insufficient documentation

## 2019-09-22 DIAGNOSIS — M25562 Pain in left knee: Secondary | ICD-10-CM | POA: Insufficient documentation

## 2019-09-22 DIAGNOSIS — R600 Localized edema: Secondary | ICD-10-CM | POA: Insufficient documentation

## 2019-09-22 DIAGNOSIS — Z87891 Personal history of nicotine dependence: Secondary | ICD-10-CM | POA: Insufficient documentation

## 2019-09-22 MED ORDER — PREDNISONE 20 MG PO TABS: 40.0000 mg | ORAL_TABLET | Freq: Every day | ORAL | 0 refills | Status: DC

## 2019-09-22 NOTE — ED Provider Notes (Signed)
[facility] EMERGENCY DEPARTMENT Provider Note   CSN: [id number] Arrival date & time: 09/22/19  [id number]     History Chief Complaint  Patient presents with  . Knee Pain    Glen Perry is a 34 y.o. male who presents with L knee pain.  Patient states that he first noticed the pain 3 days ago.  He woke up in the morning and it was fine.  He took a nap and when he woke up he started to have pain.  Pain is over the anterior portion of the knee.  He is having difficulty bearing weight due to pain.  Going up and down stairs makes it worse.  Rest makes it better.  Has been taking 800 mg of ibuprofen without significant relief.  Reports associated mild swelling.  He is wondering if it could be related to gout as he has had this before in his toe but never in his knee.  He states that the pain in his knee is worse than what he has had with gout in the past.  No fever, chills, specific injury.  HPI     History reviewed. No pertinent past medical history.  There are no problems to display for this patient.   History reviewed. No pertinent surgical history.     No family history on file.  Social History   Tobacco Use  . Smoking status: Former Smoker    Packs/day: 0.00  . Smokeless tobacco: Never Used  Substance Use Topics  . Alcohol use: No  . Drug use: Not Currently    Home Medications Prior to Admission medications   Medication Sig Start Date End Date Taking? Authorizing Provider  cyclobenzaprine (FLEXERIL) 10 MG tablet Take 1 tablet (10 mg total) by mouth 2 (two) times daily as needed for muscle spasms. 05/17/17   Recardo Evangelist, PA-C  fluticasone (FLONASE) 50 MCG/ACT nasal spray Place 1 spray into both nostrils daily. 09/17/18   Langston Masker B, PA-C  ibuprofen (ADVIL,MOTRIN) 600 MG tablet Take 1 tablet (600 mg total) by mouth every 6 (six) hours as needed. 05/17/17   Recardo Evangelist, PA-C    Allergies    Patient has no known allergies.  Review  of Systems   Review of Systems  Constitutional: Negative for fever.  Musculoskeletal: Positive for arthralgias and joint swelling.    Physical Exam Updated Vital Signs BP (!) 137/93 (BP Location: Left Arm)   Pulse 60   Temp (!) 97.5 F (36.4 C) (Oral)   Resp 19   SpO2 100%   Physical Exam Vitals and nursing note reviewed.  Constitutional:      General: He is not in acute distress.    Appearance: Normal appearance. He is well-developed. He is not ill-appearing.  HENT:     Head: Normocephalic and atraumatic.  Eyes:     General: No scleral icterus.       Right eye: No discharge.        Left eye: No discharge.     Conjunctiva/sclera: Conjunctivae normal.     Pupils: Pupils are equal, round, and reactive to light.  Cardiovascular:     Rate and Rhythm: Normal rate.  Pulmonary:     Effort: Pulmonary effort is normal. No respiratory distress.  Abdominal:     General: There is no distension.  Musculoskeletal:     Cervical back: Normal range of motion.     Comments: Left knee: No obvious swelling, deformity, or warmth. The knee cap  feels boggy but is non-tender. No tenderness to palpation. ROM is decreased due to pain but able to range ot 90 degree flexion.    Skin:    General: Skin is warm and dry.  Neurological:     Mental Status: He is alert and oriented to person, place, and time.  Psychiatric:        Behavior: Behavior normal.     ED Results / Procedures / Treatments   Labs (all labs ordered are listed, but only abnormal results are displayed) Labs Reviewed - No data to display  EKG None  Radiology DG Knee Complete 4 Views Left  Result Date: 09/22/2019 CLINICAL DATA:  Left knee pain for 3 days with swelling. EXAM: LEFT KNEE - COMPLETE 4+ VIEW COMPARISON:  None. FINDINGS: No evidence of fracture, dislocation, or joint effusion. No evidence of arthropathy or other focal bone abnormality. Soft tissues are unremarkable. IMPRESSION: Negative. Electronically Signed    By: Misty Stanley M.D.   On: 09/22/2019 06:09    Procedures Procedures (including critical care time)  Medications Ordered in ED Medications - No data to display  ED Course  I have reviewed the triage vital signs and the nursing notes.  Pertinent labs & imaging results that were available during my care of the patient were reviewed by me and considered in my medical decision making (see chart for details).  34 year old male with atraumatic knee pain and swelling for 3 days.  Vital signs are reassuring.  On exam knee overall appears normal.  He reports pain with range of motion and weightbearing.  No signs of septic joint at this time.  His x-ray is negative.  He feels it might be possible gout.  He has been taking NSAIDs without relief.  Will give him a course of prednisone and discussed supportive care including rest, ice, compression, elevation.  He does not currently have a PCP.  Advised return if worsening.  MDM Rules/Calculators/A&P                      Final Clinical Impression(s) / ED Diagnoses Final diagnoses:  Acute pain of left knee    Rx / DC Orders ED Discharge Orders    None       Recardo Evangelist, PA-C 09/22/19 [id number]    Tegeler, Gwenyth Allegra, MD 09/22/19 [phone]

## 2019-09-22 NOTE — ED Triage Notes (Signed)
Patient reports left knee pain for 3 days with swelling , denies injury/ambulatory .

## 2019-09-22 NOTE — Discharge Instructions (Addendum)
Rest - please stay off left knee as much as possible for the next couple of days Ice - ice for 20 minutes at a time, several times a day Compression - wear brace to provide support Elevate - elevate left leg above level of heart Prednisone - take 40mg  for the next 7 days

## 2019-09-23 IMAGING — CR DG HAND 2V*R*
2 series · 2 of 2 positions shown · non-contrast
Comparison: None.

CLINICAL DATA: Trauma to the right thumb yesterday with pain.

EXAM:
RIGHT HAND - 2 VIEW

[hand pa]
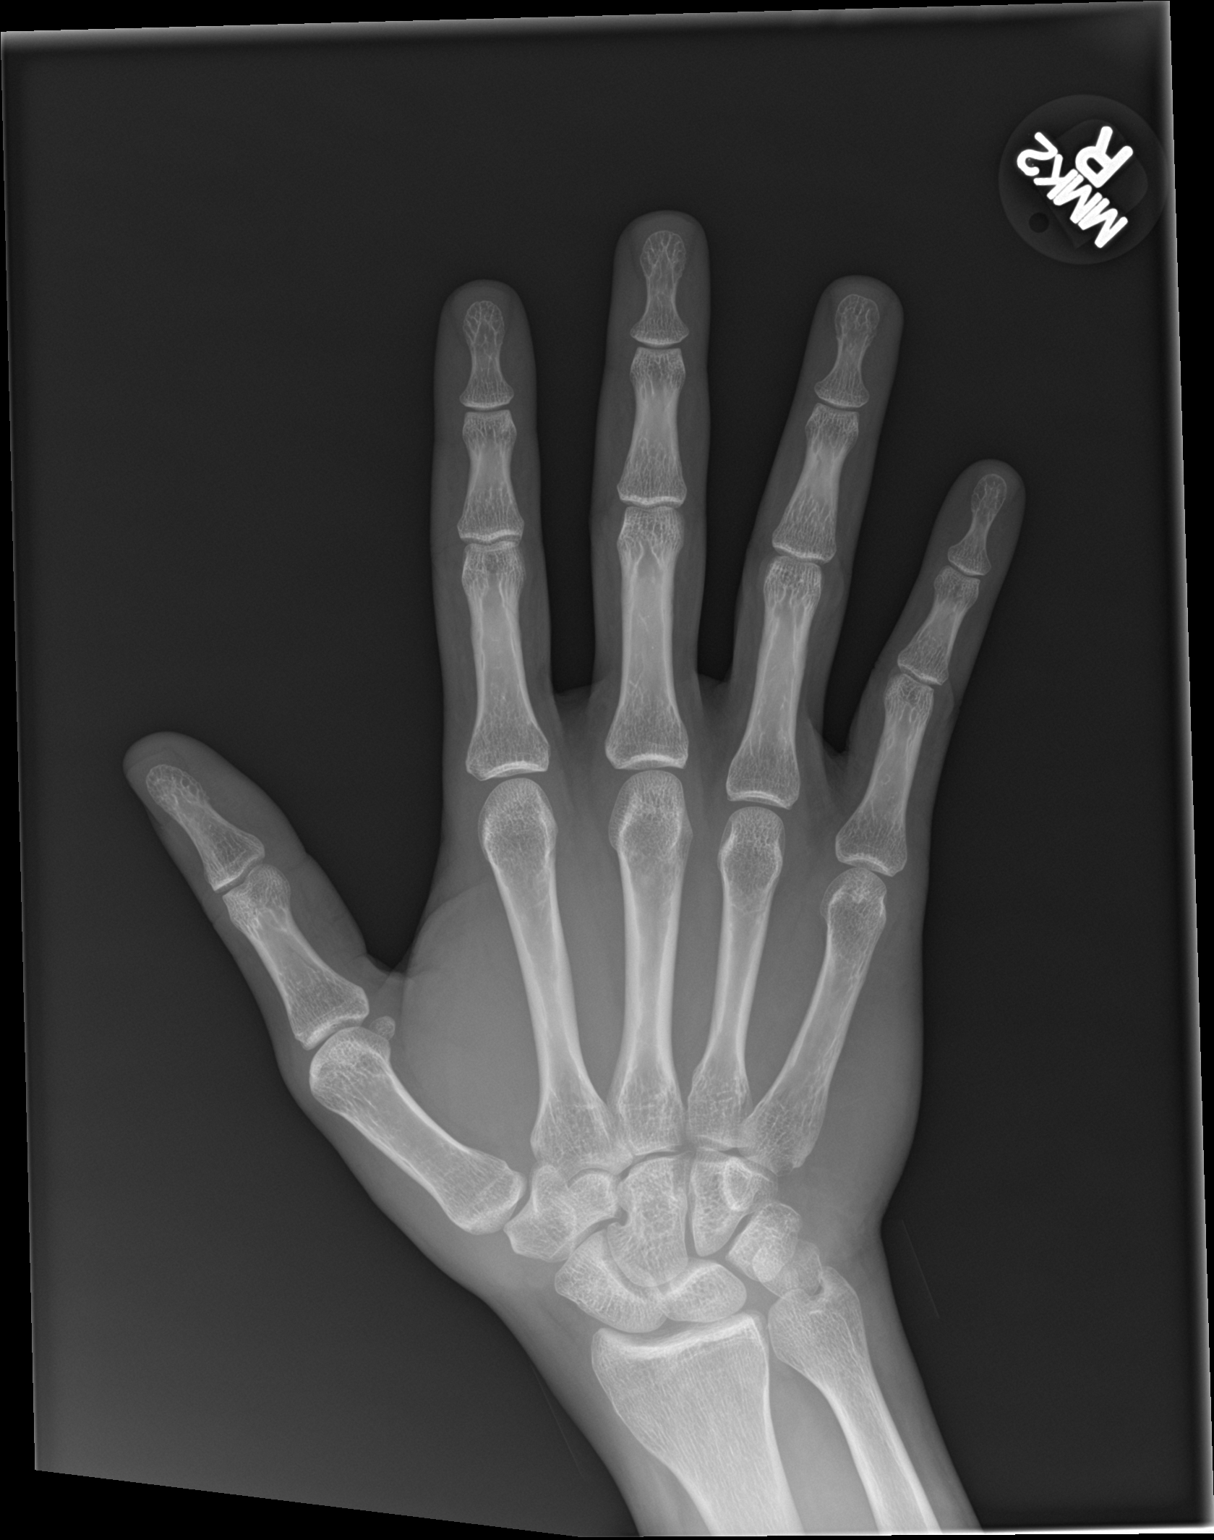

[hand lat]
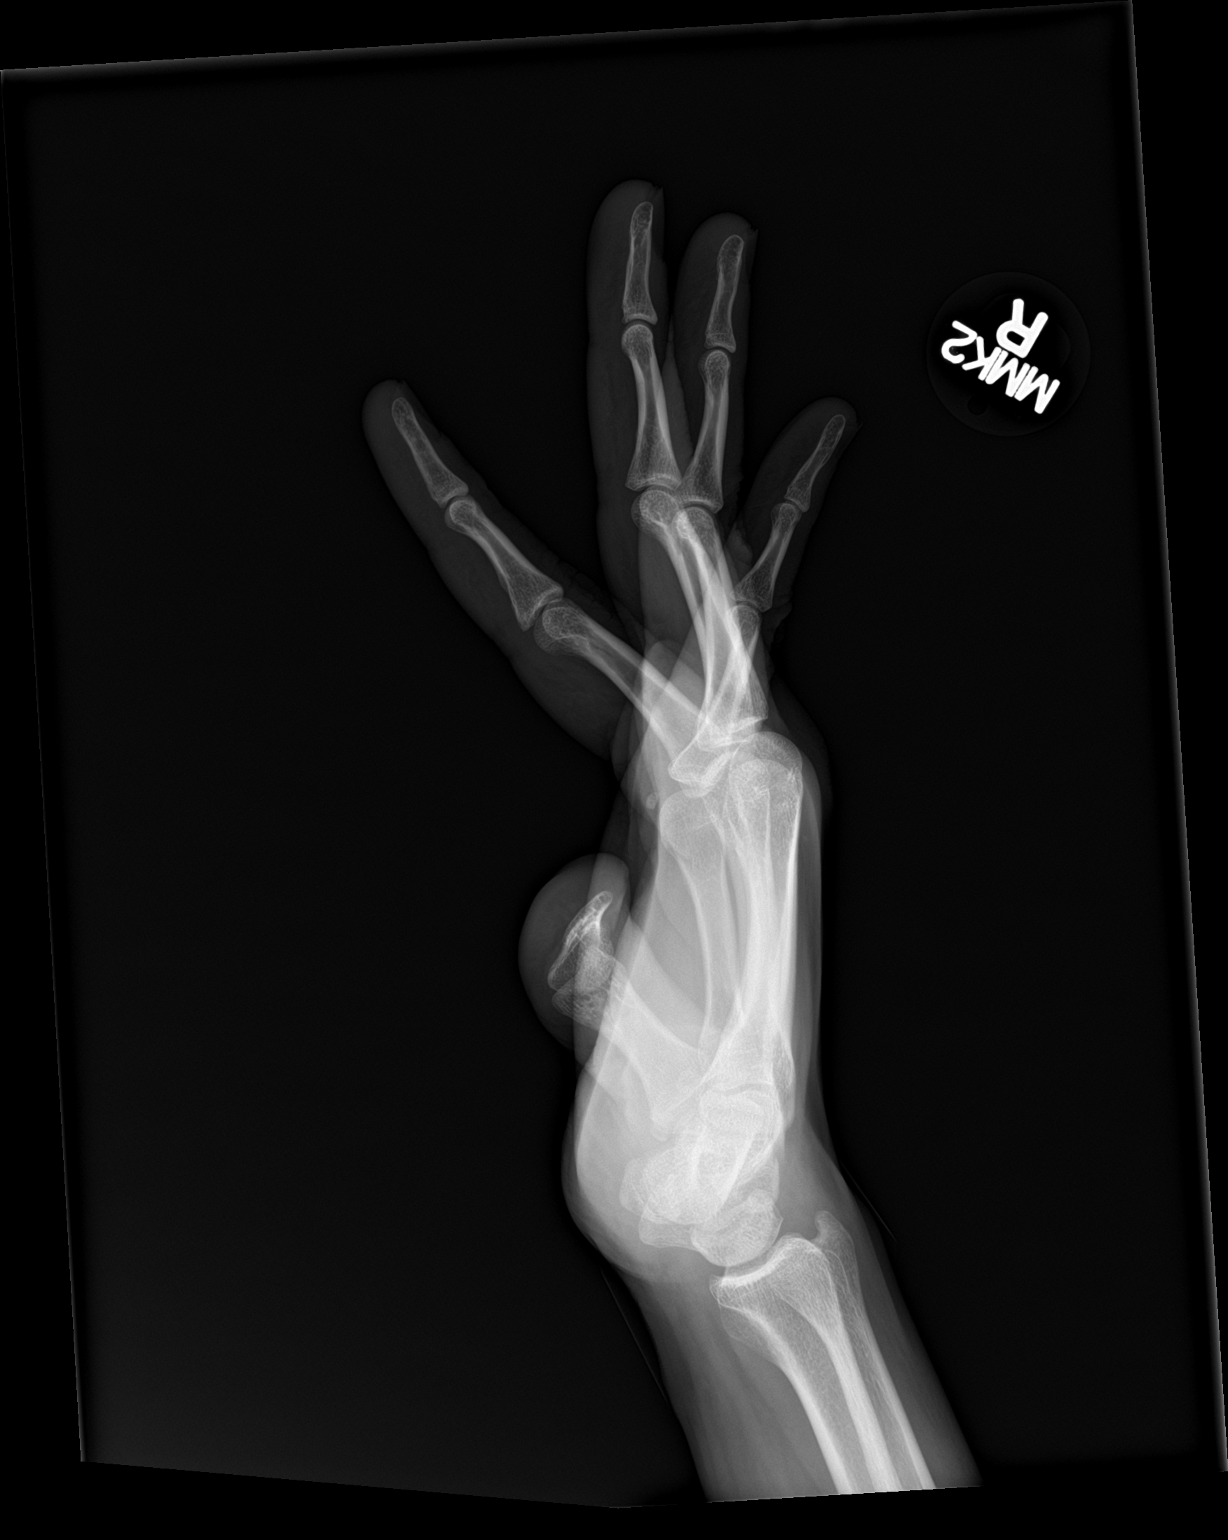

[2 of 2 positions shown; findings below may reference images not displayed]

FINDINGS: There is linear lucency of the first proximal phalanx. If the
patient has pain here, a nondisplaced fracture is not excluded.
There is no dislocation.
IMPRESSION: Linear lucency of the first proximal phalanx. The patient has pain
here, nondisplaced fracture is not excluded.

## 2019-09-24 NOTE — Progress Notes (Signed)
Patient ID: Glen Perry, male   DOB: March 18, 1986, 34 y.o.   MRN: [id number]  Virtual Visit via Telephone Note  I connected with Zain Lawry Hedgepath on 09/26/19 at  8:50 AM EST by telephone and verified that I am speaking with the correct person using two identifiers.   I discussed the limitations, risks, security and privacy concerns of performing an evaluation and management service by telephone and the availability of in person appointments. I also discussed with the patient that there may be a patient responsible charge related to this service. The patient expressed understanding and agreed to proceed.  PATIENT visit by telephone virtually in the context of Covid-19 pandemic. Patient location:  home My Location:  Home/remote office Persons on the call: me and the patient  History of Present Illness: After Ed visit 09/22/2019. He feels that he had injured his L knee while working out at [employer]. No swelling or redness.  Prednisone is helping some.  He has 2 more days on that.  He has a h/o torn meniscus on the other knee and he is concerned.  He would like to have an MRI but has no insurance.  Not currently working.  No fever.    From A/P: 34 year old male with atraumatic knee pain and swelling for 3 days.  Vital signs are reassuring.  On exam knee overall appears normal.  He reports pain with range of motion and weightbearing.  No signs of septic joint at this time.  His x-ray is negative.  He feels it might be possible gout.  He has been taking NSAIDs without relief.  Will give him a course of prednisone and discussed supportive care including rest, ice, compression, elevation.  He does not currently have a PCP.  Advised return if worsening   Observations/Objective:  NAD.  A&Ox3   Assessment and Plan: 1. Acute pain of left knee After prdnisone, he can start- - ibuprofen (ADVIL) 600 MG tablet; Take 1 tablet (600 mg total) by mouth every 6 (six) hours as needed.  Dispense: 60  tablet; Refill: 0 -will mail financial packet and consider MRI/ortho referral if not well at f/up   Follow Up Instructions: Assign PCP in 4-6 weeks   I discussed the assessment and treatment plan with the patient. The patient was provided an opportunity to ask questions and all were answered. The patient agreed with the plan and demonstrated an understanding of the instructions.   The patient was advised to call back or seek an in-person evaluation if the symptoms worsen or if the condition fails to improve as anticipated.  I provided 12 minutes of non-face-to-face time during this encounter.   Freeman Caldron, PA-C

## 2019-09-26 ENCOUNTER — Ambulatory Visit: Payer: Self-pay | Attending: Family Medicine | Admitting: Physician Assistant

## 2019-09-26 ENCOUNTER — Other Ambulatory Visit: Payer: Self-pay

## 2019-09-26 DIAGNOSIS — M25562 Pain in left knee: Secondary | ICD-10-CM

## 2019-09-26 MED ORDER — IBUPROFEN 600 MG PO TABS: 600.0000 mg | ORAL_TABLET | Freq: Four times a day (QID) | ORAL | 0 refills | Status: DC | PRN

## 2019-09-26 NOTE — Progress Notes (Signed)
Left knee pain Wants MRI.

## 2020-01-05 ENCOUNTER — Other Ambulatory Visit: Payer: Self-pay

## 2020-01-05 ENCOUNTER — Emergency Department (HOSPITAL_COMMUNITY)
Admission: EM | Admit: 2020-01-05 | Discharge: 2020-01-05 | Disposition: A | Discharge status: Home / Self Care | Payer: Self-pay | Attending: Emergency Medicine | Admitting: Emergency Medicine

## 2020-01-05 ENCOUNTER — Encounter (HOSPITAL_COMMUNITY): Payer: Self-pay

## 2020-01-05 DIAGNOSIS — Z87891 Personal history of nicotine dependence: Secondary | ICD-10-CM | POA: Insufficient documentation

## 2020-01-05 DIAGNOSIS — M2392 Unspecified internal derangement of left knee: Secondary | ICD-10-CM | POA: Insufficient documentation

## 2020-01-05 DIAGNOSIS — M25562 Pain in left knee: Secondary | ICD-10-CM

## 2020-01-05 MED ORDER — CYCLOBENZAPRINE HCL 10 MG PO TABS: 10.0000 mg | ORAL_TABLET | Freq: Two times a day (BID) | ORAL | 0 refills | Status: DC | PRN

## 2020-01-05 MED ORDER — IBUPROFEN 600 MG PO TABS: 600.0000 mg | ORAL_TABLET | Freq: Four times a day (QID) | ORAL | 0 refills | Status: DC | PRN

## 2020-01-05 NOTE — ED Provider Notes (Signed)
[facility] EMERGENCY DEPARTMENT Provider Note   CSN: [id number] Arrival date & time: 01/05/20  1304     History No chief complaint on file.   Glen Perry is a 34 y.o. male.  The history is provided by the patient and medical records. No language interpreter was used.     35 year old male presenting for evaluation of knee pain.  Patient report for the past 4 months he has been experiencing intermittent left knee pain.  He described pain as a sharp sensation to the anterior knee, worse with standing and with certain movement.  Sometimes he feels as if the knee is going to lock up on him.  No associated numbness or weakness no fever chills no rash no swelling noted.  He denies any specific injury that he can recall although he does work out often.  He has been taking ibuprofen at home without adequate relief.  He report being seen in the ED 3 months ago for the same, x-ray at that time was negative, he was prescribed prednisone which he took for 2 weeks but noticed no improvement.  He recently acquired insurance and would like to have a referral.  No complaints of hip or ankle pain.  History reviewed. No pertinent past medical history.  There are no problems to display for this patient.   History reviewed. No pertinent surgical history.     No family history on file.  Social History   Tobacco Use  . Smoking status: Former Smoker    Packs/day: 0.00  . Smokeless tobacco: Never Used  Substance Use Topics  . Alcohol use: No  . Drug use: Not Currently    Home Medications Prior to Admission medications   Medication Sig Start Date End Date Taking? Authorizing Provider  cyclobenzaprine (FLEXERIL) 10 MG tablet Take 1 tablet (10 mg total) by mouth 2 (two) times daily as needed for muscle spasms. Patient not taking: Reported on 09/26/2019 05/17/17   Recardo Evangelist, PA-C  fluticasone [facility] [place]) 50 MCG/ACT nasal spray Place 1 spray into both nostrils  daily. Patient not taking: Reported on 09/26/2019 09/17/18   Langston Masker B, PA-C  ibuprofen (ADVIL) 600 MG tablet Take 1 tablet (600 mg total) by mouth every 6 (six) hours as needed. 09/26/19   Argentina Donovan, PA-C  predniSONE (DELTASONE) 20 MG tablet Take 2 tablets (40 mg total) by mouth daily. 09/22/19   Recardo Evangelist, PA-C    Allergies    Patient has no known allergies.  Review of Systems   Review of Systems  Constitutional: Negative for fever.  Musculoskeletal: Positive for arthralgias.  Skin: Negative for wound.  Neurological: Negative for numbness.    Physical Exam Updated Vital Signs BP (!) 137/102 (BP Location: Left Arm)   Pulse (!) 58   Temp 98.3 F (36.8 C) (Oral)   Resp 18   SpO2 100%   Physical Exam Vitals and nursing note reviewed.  Constitutional:      General: He is not in acute distress.    Appearance: He is well-developed.  HENT:     Head: Atraumatic.  Eyes:     Conjunctiva/sclera: Conjunctivae normal.  Musculoskeletal:        General: Tenderness (Left knee: Mild tenderness noted to the anterior knee at the patella with normal knee flexion extension no joint laxity no swelling noted no crepitus and no erythema or warmth.) present.     Cervical back: Neck supple.     Comments: Left hip  and left ankle nontender.  Skin:    Findings: No rash.  Neurological:     Mental Status: He is alert.     Comments: Ambulate without difficulty     ED Results / Procedures / Treatments   Labs (all labs ordered are listed, but only abnormal results are displayed) Labs Reviewed - No data to display  EKG None  Radiology No results found.  Procedures Procedures (including critical care time)  Medications Ordered in ED Medications - No data to display  ED Course  I have reviewed the triage vital signs and the nursing notes.  Pertinent labs & imaging results that were available during my care of the patient were reviewed by me and considered in my  medical decision making (see chart for details).    MDM Rules/Calculators/A&P                      BP (!) 137/102 (BP Location: Left Arm)   Pulse (!) 58   Temp 98.3 F (36.8 C) (Oral)   Resp 18   SpO2 100%   Final Clinical Impression(s) / ED Diagnoses Final diagnoses:  Internal derangement of left knee    Rx / DC Orders ED Discharge Orders         Ordered    cyclobenzaprine (FLEXERIL) 10 MG tablet  2 times daily PRN     01/05/20 1621    ibuprofen (ADVIL) 600 MG tablet  Every 6 hours PRN     01/05/20 1621         4:20 PM Recurrent pain to left knee, unsure of any specific trauma to the knee.  Has been evaluated several months ago for same with negative x-ray of his left knee.  On exam no evidence to suggest infectious etiology no signs of septic joint, cellulitis, or signs of DVT.  Low suspicion for gout.  Doubt fracture or dislocation when considering previous normal x-ray of the knee.  I suspect this could be a meniscal or ligament injury.  Will refer to orthopedist for outpatient follow-up.   Domenic Moras, PA-C 01/05/20 [place], [place], DO 01/05/20 2006

## 2020-01-05 NOTE — ED Triage Notes (Signed)
Patient complains of ongoing left knee pain. Seen previously for same and states pain with activity, no new trauma

## 2020-01-05 NOTE — Discharge Instructions (Signed)
Your knee pain may be due to meniscal or ligament injury.  Follow-up closely with orthopedist for further care.  Take medication prescribed as needed.  Wear knee sleeve for support.

## 2020-02-11 ENCOUNTER — Ambulatory Visit: Payer: 59 | Admitting: Orthopaedic Surgery

## 2020-02-11 ENCOUNTER — Other Ambulatory Visit: Payer: Self-pay

## 2020-02-11 ENCOUNTER — Encounter: Payer: Self-pay | Admitting: Orthopaedic Surgery

## 2020-02-11 DIAGNOSIS — M25562 Pain in left knee: Secondary | ICD-10-CM

## 2020-02-11 DIAGNOSIS — G8929 Other chronic pain: Secondary | ICD-10-CM

## 2020-02-11 MED ORDER — NAPROXEN 500 MG PO TABS: 500.0000 mg | ORAL_TABLET | Freq: Two times a day (BID) | ORAL | 3 refills | Status: DC

## 2020-02-11 NOTE — Progress Notes (Signed)
Office Visit Note   Patient: Glen Perry           Date of Birth: April 29, 1986           MRN: [id number] Visit Date: 02/11/2020              Requested by: No referring provider defined for this encounter. PCP: Patient, No Pcp Per   Assessment & Plan: Visit Diagnoses:  1. Chronic pain of left knee     Plan: Impression is bone contusion versus symptomatic medial plica.  Based on discussion we will do a 6-week trial of knee brace, naproxen, Voltaren gel and activity as tolerated.  Patient instructed to follow-up if he continues to be symptomatic after this period of conservative treatment.  Follow-Up Instructions: Return if symptoms worsen or fail to improve.   Orders:  No orders of the defined types were placed in this encounter.  Meds ordered this encounter  Medications  . naproxen (NAPROSYN) 500 MG tablet    Sig: Take 1 tablet (500 mg total) by mouth 2 (two) times daily with a meal.    Dispense:  30 tablet    Refill:  3      Procedures: No procedures performed   Clinical Data: No additional findings.   Subjective: Chief Complaint  Patient presents with  . Left Knee - Pain    Glen Perry is a 34 year old gentleman comes in for for evaluation of chronic left knee pain since February when he was working out and struck his knee on the floor.  He endorses pain on the medial portion of the patella.  He denies any mechanical symptoms.  He has some discomfort with using stairs.  Denies any swelling.  He feels that it is painful when he is kneeling and he cannot run as quickly as he wants to.  He has been taking some ibuprofen and Flexeril mainly for the back.  He took a short course of prednisone which helped the knee pain.  Denies any twisting injuries to the knee.   Review of Systems  Constitutional: Negative.   All other systems reviewed and are negative.    Objective: Vital Signs: There were no vitals taken for this visit.  Physical Exam Vitals and nursing  note reviewed.  Constitutional:      Appearance: He is well-developed.  HENT:     Head: Normocephalic and atraumatic.  Eyes:     Pupils: Pupils are equal, round, and reactive to light.  Pulmonary:     Effort: Pulmonary effort is normal.  Abdominal:     Palpations: Abdomen is soft.  Musculoskeletal:        General: Normal range of motion.     Cervical back: Neck supple.  Skin:    General: Skin is warm.  Neurological:     Mental Status: He is alert and oriented to person, place, and time.  Psychiatric:        Behavior: Behavior normal.        Thought Content: Thought content normal.        Judgment: Judgment normal.     Ortho Exam Left knee shows no joint effusion.  He has mild tenderness along the medial border of the patella.  The there is slight tenderness of the medial patellofemoral articulation.  No joint line tenderness.  Collaterals and cruciates are stable. Specialty Comments:  No specialty comments available.  Imaging: No results found.   PMFS History: There are no problems to display for this patient.  History reviewed. No pertinent past medical history.  History reviewed. No pertinent family history.  History reviewed. No pertinent surgical history. Social History   Occupational History  . Not on file  Tobacco Use  . Smoking status: Former Smoker    Packs/day: 0.00  . Smokeless tobacco: Never Used  Substance and Sexual Activity  . Alcohol use: No  . Drug use: Not Currently  . Sexual activity: Not on file

## 2020-02-18 ENCOUNTER — Ambulatory Visit: Payer: Self-pay | Admitting: Surgery

## 2020-02-18 NOTE — H&P (Signed)
CC: Non-recurrent bilateral inguinal hernia without obstruction or gangrene [K40.20]  HPI: Glen Perry is a 34 y.o. male who was referred by Dionicia Abler, PA for evaluation of above. Symptoms were first noted several years ago. Asymptomatic, but does not like way they look.   Mass on arms he will like to be excised if possible.   Past Medical History: has a past medical history of GERD (gastroesophageal reflux disease) and Hernia, inguinal, bilateral.  Past Surgical History: No past surgical history on file.  Family History: family history is not on file.  Social History: reports that he has never smoked. He has never used smokeless tobacco. He reports that he does not drink alcohol and does not use drugs.  Current Medications: has a current medication list which includes the following prescription(s): cyclobenzaprine.  Allergies:  Allergies as of 02/12/2020  . (No Known Allergies)   ROS:  A 15 point review of systems was performed and pertinent positives and negatives noted in HPI  Objective:    BP 125/72  Pulse 67  Ht 181.6 cm (5' 11.5")  Wt 89.8 kg (198 lb)  BMI 27.23 kg/m   Constitutional : alert, appears stated age, cooperative and no distress  Lymphatics/Throat: no asymmetry, masses, or scars  Respiratory: clear to auscultation bilaterally  Cardiovascular: regular rate and rhythm  Gastrointestinal: soft, non-tender; bowel sounds normal; no masses, no organomegaly. inguinal hernia noted. small, reducible, no overlying skin changes and bilateral  Musculoskeletal: Steady gait and movement  Skin: Cool and moist, three lipomas in right arm. Ulnar aspect towards wrist, palmar surface, mid forearm, and triceps area.  Psychiatric: Normal affect, non-agitated, not confused    LABS:  n/a   RADS: n/a Assessment:    Non-recurrent bilateral inguinal hernia without obstruction or gangrene [K40.20]   Right arm lipoma x3  Plan:    1. Non-recurrent bilateral  inguinal hernia without obstruction or gangrene [K40.20]  Discussed the risk of surgery including recurrence, which can be up to 50% in the case of incisional or complex hernias, possible use of prosthetic materials (mesh) and the increased risk of mesh infxn if used, bleeding, chronic pain, post-op infxn, post-op SBO or ileus, and possible re-operation to address said risks. The risks of general anesthetic, if used, includes MI, CVA, sudden death or even reaction to anesthetic medications also discussed. Alternatives include continued observation. Benefits include possible symptom relief, prevention of incarceration, strangulation, enlargement in size over time, and the risk of emergency surgery in the face of strangulation.   Typical post-op recovery time of 3-5 days with 2 weeks of activity restrictions were also discussed.  ED return precautions given for sudden increase in pain, size of hernia with accompanying fever, nausea, and/or vomiting.  The patient verbalized understanding and all questions were answered to the patient's satisfaction.  2. Patient has elected to proceed with surgical treatment. Procedure will be scheduled. Written consent was obtained.Marland Kitchen BILATERAL, robotic assisted laparoscopic  Arm lipoma removal in office after hernia surgery    Electronically signed by Benjamine Sprague, DO at 02/12/2020 3:12 PM ED

## 2020-02-25 ENCOUNTER — Other Ambulatory Visit: Payer: Self-pay

## 2020-02-25 ENCOUNTER — Encounter
Admission: RE | Admit: 2020-02-25 | Discharge: 2020-02-25 | Disposition: A | Discharge status: Home / Self Care | Payer: 59 | Source: Ambulatory Visit | Attending: Surgery | Admitting: Surgery

## 2020-02-25 DIAGNOSIS — Z01818 Encounter for other preprocedural examination: Secondary | ICD-10-CM | POA: Insufficient documentation

## 2020-02-25 HISTORY — DX: Abnormal levels of other serum enzymes: R74.8

## 2020-02-25 NOTE — Patient Instructions (Signed)
Your procedure is scheduled on: 03-05-20 THURSDAY Report to Same Day Surgery 2nd floor medical mall [facility] Entrance-take elevator on left to 2nd floor.  Check in with surgery information desk.) To find out your arrival time please call [phone] between 1PM - 3PM on 03-04-20 [facility]  Remember: Instructions that are not followed completely may result in serious medical risk, up to and including death, or upon the discretion of your surgeon and anesthesiologist your surgery may need to be rescheduled.    _x___ 1. Do not eat food after midnight the night before your procedure. NO GUM OR CANDY AFTER MIDNIGHT. You may drink clear liquids up to 2 hours before you are scheduled to arrive at the hospital for your procedure.  Do not drink clear liquids within 2 hours of your scheduled arrival to the hospital.  Clear liquids include  --Water or Apple juice without pulp  --Gatorade  --Black Coffee or Clear Tea (No milk, no creamers, do not add anything to the coffee or Tea-ok to add sugar)   ____Ensure clear carbohydrate drink on the way to the hospital for bariatric patients  ____Ensure clear carbohydrate drink 3 hours before surgery.     __x__ 2. No Alcohol for 24 hours before or after surgery.   __x__3. No Smoking or e-cigarettes for 24 prior to surgery.  Do not use any chewable tobacco products for at least 6 hour prior to surgery   ____  4. Bring all medications with you on the day of surgery if instructed.    __x__ 5. Notify your doctor if there is any change in your medical condition     (cold, fever, infections).    x___6. On the morning of surgery brush your teeth with toothpaste and water.  You may rinse your mouth with mouth wash if you wish.  Do not swallow any toothpaste or mouthwash.   Do not wear jewelry, make-up, hairpins, clips or nail polish.  Do not wear lotions, powders, or perfumes.   Do not shave 48 hours prior to surgery. Men may shave face and neck.  Do not  bring valuables to the hospital.    [facility] is not responsible for any belongings or valuables.               Contacts, dentures or bridgework may not be worn into surgery.  Leave your suitcase in the car. After surgery it may be brought to your room.  For patients admitted to the hospital, discharge time is determined by your treatment team.  _  Patients discharged the day of surgery will not be allowed to drive home.  You will need someone to drive you home and stay with you the night of your procedure.    Please read over the following fact sheets that you were given:   [facility] Preparing for Surgery  ____ TAKE THE FOLLOWING MEDICATION THE MORNING OF SURGERY WITH A SMALL SIP OF WATER. These include:  1. NONE  2.  3.  4.  5.  6.  ____Fleets enema or Magnesium Citrate as directed.   _x___ Use CHG Soap or sage wipes as directed on instruction sheet   ____ Use inhalers on the day of surgery and bring to hospital day of surgery  ____ Stop Metformin and Janumet 2 days prior to surgery.    ____ Take 1/2 of usual insulin dose the night before surgery and none on the morning  surgery.   ____ Follow recommendations from Cardiologist, Pulmonologist  or PCP regarding stopping Aspirin, Coumadin, Plavix ,Eliquis, Effient, or Pradaxa, and Pletal.  X____Stop Anti-inflammatories such as Advil, Aleve, Ibuprofen, Motrin, Naproxen, Naprosyn, Goodies powders or aspirin products NOW-OK to take Tylenol   ____ Stop supplements until after surgery.    ____ Bring C-Pap to the hospital.

## 2020-03-03 ENCOUNTER — Other Ambulatory Visit
Admission: RE | Admit: 2020-03-03 | Discharge: 2020-03-03 | Disposition: A | Discharge status: Home / Self Care | Payer: 59 | Source: Ambulatory Visit | Attending: Surgery | Admitting: Surgery

## 2020-03-03 NOTE — Pre-Procedure Instructions (Addendum)
Contacted patient via phone to let him know that he missed his  Covid testing appointment.  He stated that he had cancelled this surgery and would not be here for swabbing.  I asked if he let his physician know and he said yes.  Unable to make contact with Dr. Ines Bloomer office so I secure chatted this information to Dr. Lysle Pearl

## 2020-03-05 ENCOUNTER — Ambulatory Visit: Admission: RE | Admit: 2020-03-05 | Payer: 59 | Source: Home / Self Care | Admitting: Surgery

## 2020-03-05 ENCOUNTER — Encounter: Admission: RE | Payer: Self-pay | Source: Home / Self Care

## 2020-03-05 SURGERY — REPAIR, HERNIA, INGUINAL, ROBOT-ASSISTED, LAPAROSCOPIC, USING MESH
Anesthesia: General | Site: Abdomen | Laterality: Bilateral

## 2020-03-24 ENCOUNTER — Ambulatory Visit: Payer: 59 | Admitting: Orthopaedic Surgery

## 2020-04-02 ENCOUNTER — Ambulatory Visit: Payer: 59 | Admitting: Orthopaedic Surgery

## 2020-04-09 ENCOUNTER — Other Ambulatory Visit: Payer: Self-pay

## 2020-04-09 ENCOUNTER — Ambulatory Visit (INDEPENDENT_AMBULATORY_CARE_PROVIDER_SITE_OTHER): Payer: 59 | Admitting: Orthopaedic Surgery

## 2020-04-09 ENCOUNTER — Ambulatory Visit (INDEPENDENT_AMBULATORY_CARE_PROVIDER_SITE_OTHER): Payer: 59

## 2020-04-09 DIAGNOSIS — M25562 Pain in left knee: Secondary | ICD-10-CM | POA: Diagnosis not present

## 2020-04-09 DIAGNOSIS — M25512 Pain in left shoulder: Secondary | ICD-10-CM

## 2020-04-09 DIAGNOSIS — G8929 Other chronic pain: Secondary | ICD-10-CM | POA: Diagnosis not present

## 2020-04-09 NOTE — Progress Notes (Signed)
   Office Visit Note   Patient: Glen Perry           Date of Birth: 1986/05/24           MRN: [id number] Visit Date: 04/09/2020              Requested by: No referring provider defined for this encounter. PCP: Patient, No Pcp Per   Assessment & Plan: Visit Diagnoses:  1. Acute pain of left shoulder   2. Chronic pain of left knee     Plan: We will order an MRI of the left knee at this point given the lack of improvement from conservative treatment from these last 6 weeks.  He also continues to have left shoulder pain therefore we will also order MRI.  Follow-up after the MRIs.  Follow-Up Instructions: Return if symptoms worsen or fail to improve.   Orders:  Orders Placed This Encounter  Procedures  . XR Shoulder Left   No orders of the defined types were placed in this encounter.     Procedures: No procedures performed   Clinical Data: No additional findings.   Subjective: Chief Complaint  Patient presents with  . Left Shoulder - Pain    Glen Perry returns today for continued left knee pain.  He states that it is no better.  He has also felt discomfort in his left shoulder that is worse with lifting his covers and lifting his arm.   Review of Systems   Objective: Vital Signs: There were no vitals taken for this visit.  Physical Exam  Ortho Exam Left shoulder shows full range of motion without pain.  He has good manual strength.  No impingement signs.  No swelling.  Left knee exam is unchanged. Specialty Comments:  No specialty comments available.  Imaging: No results found.   PMFS History: There are no problems to display for this patient.  Past Medical History:  Diagnosis Date  . Elevated liver enzymes     No family history on file.  Past Surgical History:  Procedure Laterality Date  . NO PAST SURGERIES     Social History   Occupational History  . Not on file  Tobacco Use  . Smoking status: Never Smoker  . Smokeless tobacco:  Never Used  Vaping Use  . Vaping Use: Never used  Substance and Sexual Activity  . Alcohol use: No  . Drug use: Not Currently  . Sexual activity: Not on file

## 2020-05-06 ENCOUNTER — Other Ambulatory Visit: Payer: 59

## 2020-06-07 ENCOUNTER — Ambulatory Visit
Admission: RE | Admit: 2020-06-07 | Discharge: 2020-06-07 | Disposition: A | Discharge status: Home / Self Care | Payer: 59 | Source: Ambulatory Visit | Attending: Orthopaedic Surgery | Admitting: Orthopaedic Surgery

## 2020-06-07 DIAGNOSIS — M25512 Pain in left shoulder: Secondary | ICD-10-CM

## 2020-06-07 DIAGNOSIS — M25562 Pain in left knee: Secondary | ICD-10-CM

## 2020-06-11 ENCOUNTER — Other Ambulatory Visit: Payer: Self-pay

## 2020-06-11 ENCOUNTER — Ambulatory Visit (INDEPENDENT_AMBULATORY_CARE_PROVIDER_SITE_OTHER): Payer: 59 | Admitting: Orthopaedic Surgery

## 2020-06-11 ENCOUNTER — Encounter: Payer: Self-pay | Admitting: Orthopaedic Surgery

## 2020-06-11 DIAGNOSIS — G8929 Other chronic pain: Secondary | ICD-10-CM | POA: Diagnosis not present

## 2020-06-11 DIAGNOSIS — M24112 Other articular cartilage disorders, left shoulder: Secondary | ICD-10-CM | POA: Diagnosis not present

## 2020-06-11 DIAGNOSIS — M25562 Pain in left knee: Secondary | ICD-10-CM

## 2020-06-11 MED ORDER — PREDNISONE 10 MG (21) PO TBPK: ORAL_TABLET | ORAL | 0 refills | Status: DC

## 2020-06-11 NOTE — Progress Notes (Signed)
   Office Visit Note   Patient: Glen Perry           Date of Birth: 05/02/1986           MRN: [id number] Visit Date: 06/11/2020              Requested by: No referring provider defined for this encounter. PCP: Patient, No Pcp Per   Assessment & Plan: Visit Diagnoses:  1. 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 Chronic pain of left knee     Plan: Impression is left knee fissuring and bone marrow edema to the trochlear groove and left shoulder posterior labrum degeneration without tear.  In regards to the knee, he needs to give this more time.  We will also go ahead and call in a steroid Dosepak.  In regards to the shoulder, have offered cortisone injection but he has politely declined.  He will follow up with [place] if his symptoms do not improve or if continue to worsen.  Follow-Up Instructions: Return if symptoms worsen or fail to improve.   Orders:  No orders of the defined types were placed in this encounter.  Meds ordered this encounter  Medications  . predniSONE (STERAPRED UNI-PAK 21 TAB) 10 MG (21) TBPK tablet    Sig: Take as directed    Dispense:  21 tablet    Refill:  0      Procedures: No procedures performed   Clinical Data: No additional findings.   Subjective: Chief Complaint  Patient presents with  . Left Shoulder - Pain    HPI patient is a pleasant 34 year old male who comes in today to discuss MRI results of the left knee and left shoulder.  He has had pain to both areas since February of this year after falling onto the anterior knee.  He has tried bracing and naproxen for many weeks without relief of symptoms.  Subsequent MRI of the left shoulder and left knee were ordered.  MRI of the left knee shows focal cartilage fissure and subchondral marrow edema to the trochlear groove.  Left shoulder MRI shows degeneration of the posterior labrum.  Has not had cortisone injections to the shoulder.     Objective: Vital Signs:  There were no vitals taken for this visit.    Ortho Exam stable left shoulder and knee exam  Specialty Comments:  No specialty comments available.  Imaging: No new imaging   PMFS History: There are no problems to display for this patient.  Past Medical History:  Diagnosis Date  . Elevated liver enzymes     History reviewed. No pertinent family history.  Past Surgical History:  Procedure Laterality Date  . NO PAST SURGERIES     Social History   Occupational History  . Not on file  Tobacco Use  . Smoking status: Never Smoker  . Smokeless tobacco: Never Used  Vaping Use  . Vaping Use: Never used  Substance and Sexual Activity  . Alcohol use: No  . Drug use: Not Currently  . Sexual activity: Not on file

## 2020-07-30 ENCOUNTER — Ambulatory Visit: Payer: 59 | Admitting: Orthopaedic Surgery

## 2020-08-18 ENCOUNTER — Ambulatory Visit: Payer: 59 | Admitting: Orthopaedic Surgery

## 2020-08-25 ENCOUNTER — Ambulatory Visit: Payer: 59 | Admitting: Orthopaedic Surgery

## 2020-11-12 ENCOUNTER — Encounter: Payer: Self-pay | Admitting: Radiology

## 2020-11-12 ENCOUNTER — Encounter: Payer: Self-pay | Admitting: Orthopaedic Surgery

## 2020-11-12 NOTE — Progress Notes (Signed)
[employer] Certified Mail Receipt from 08/31/2020 scanned in.

## 2021-03-15 ENCOUNTER — Encounter (HOSPITAL_COMMUNITY): Payer: Self-pay | Admitting: Emergency Medicine

## 2021-03-15 ENCOUNTER — Other Ambulatory Visit: Payer: Self-pay

## 2021-03-15 ENCOUNTER — Emergency Department (HOSPITAL_COMMUNITY)
Admission: EM | Admit: 2021-03-15 | Discharge: 2021-03-15 | Disposition: A | Discharge status: Home / Self Care | Payer: Medicaid Other | Attending: Emergency Medicine | Admitting: Emergency Medicine

## 2021-03-15 ENCOUNTER — Emergency Department (HOSPITAL_COMMUNITY): Payer: Medicaid Other

## 2021-03-15 DIAGNOSIS — M7552 Bursitis of left shoulder: Secondary | ICD-10-CM | POA: Insufficient documentation

## 2021-03-15 MED ORDER — OMEPRAZOLE 20 MG PO CPDR: 20.0000 mg | DELAYED_RELEASE_CAPSULE | Freq: Every evening | ORAL | 0 refills | Status: DC

## 2021-03-15 MED ORDER — MELOXICAM 15 MG PO TABS: 15.0000 mg | ORAL_TABLET | Freq: Every evening | ORAL | 0 refills | Status: DC

## 2021-03-15 NOTE — ED Triage Notes (Signed)
Patient arrives ambulatory c/o left shoulder pain. States he was sitting in a chair and leaned forward and heard a pop. Injury occurred in late April. Full ROM to shoulder.

## 2021-03-15 NOTE — Discharge Instructions (Addendum)
Take the medicines prescribed before dinner for the next 2 weeks.  Read RICE therapy instructions provided and see the sports medicine doctor if not improving in 2 weeks.

## 2021-03-15 NOTE — ED Provider Notes (Signed)
[facility] EMERGENCY DEPARTMENT Provider Note   CSN: [id number] Arrival date & time: 03/15/21  0740     History Chief Complaint  Patient presents with   Shoulder Injury    Glen Perry is a 35 y.o. male.  HPI    35 year old male comes in with chief complaint of shoulder pain. Patient reports that 2 weeks ago he was sitting on a chair and tried to lift something.  He heard a twitch and has been having left shoulder pain since then.  The pain is present when he first wakes up in the morning, and then he notices again at nighttime when he is not moving it much.  No associated numbness, tingling.  No prior history of injury   Past Medical History:  Diagnosis Date   Elevated liver enzymes     There are no problems to display for this patient.   Past Surgical History:  Procedure Laterality Date   NO PAST SURGERIES         No family history on file.  Social History   Tobacco Use   Smoking status: Never   Smokeless tobacco: Never  Vaping Use   Vaping Use: Never used  Substance Use Topics   Alcohol use: No   Drug use: Not Currently    Home Medications Prior to Admission medications   Medication Sig Start Date End Date Taking? Authorizing Provider  meloxicam (MOBIC) 15 MG tablet Take 1 tablet (15 mg total) by mouth at bedtime. 03/15/21  Yes Varney Biles, MD  omeprazole (PRILOSEC) 20 MG capsule Take 1 capsule (20 mg total) by mouth at bedtime. 03/15/21  Yes Varney Biles, MD  cyclobenzaprine (FLEXERIL) 10 MG tablet Take 1 tablet (10 mg total) by mouth 2 (two) times daily as needed for muscle spasms. Patient not taking: Reported on 02/13/2020 01/05/20   Domenic Moras, PA-C  cyclobenzaprine (FLEXERIL) 5 MG tablet Take 5 mg by mouth daily as needed for muscle spasms. Patient not taking: Reported on 02/25/2020    [provider]  ibuprofen (ADVIL) 600 MG tablet Take 1 tablet (600 mg total) by mouth every 6 (six) hours as needed for  moderate pain. Patient not taking: Reported on 02/11/2020 01/05/20   Domenic Moras, PA-C  Multiple Vitamin (MULTIVITAMIN WITH MINERALS) TABS tablet Take 1 tablet by mouth daily. One-A-Day for Men    [provider]  predniSONE (STERAPRED UNI-PAK 21 TAB) 10 MG (21) TBPK tablet Take as directed 06/11/20   Aundra Dubin, PA-C    Allergies    Patient has no known allergies.  Review of Systems   Review of Systems  Constitutional:  Positive for activity change.  Musculoskeletal:  Positive for arthralgias.   Physical Exam Updated Vital Signs BP (!) 129/96 (BP Location: Right Arm)   Pulse (!) 47   Temp (!) 97.5 F (36.4 C) (Oral)   Resp 14   Ht 5\' 11"  (1.803 m)   Wt 83.9 kg   SpO2 100%   BMI 25.80 kg/m   Physical Exam Vitals and nursing note reviewed.  Constitutional:      Appearance: He is well-developed.  HENT:     Head: Atraumatic.  Cardiovascular:     Rate and Rhythm: Normal rate.  Pulmonary:     Effort: Pulmonary effort is normal.  Musculoskeletal:        General: Swelling and tenderness present.     Cervical back: Neck supple.     Comments: Patient starts having tenderness of  the left shoulder abduction at about 30 degrees, and it persist as he continues the abduction.  No worsening of the symptoms.  Patient has no significant worsening or provocation of the pain with internal, external rotation of the shoulder.  Skin:    General: Skin is warm.  Neurological:     Mental Status: He is alert and oriented to person, place, and time.    ED Results / Procedures / Treatments   Labs (all labs ordered are listed, but only abnormal results are displayed) Labs Reviewed - No data to display  EKG None  Radiology DG Shoulder Left  Result Date: 03/15/2021 CLINICAL DATA:  Patient reports left superior shoulder pain since April 2022. Reports felt something shift while in awkward position trying to lift object. Now reports left AC joint sits higher than his right. Painful  to abduct left shoulder. Denies any prior injury or surgery to left shoulder. EXAM: LEFT SHOULDER - 2+ VIEW COMPARISON:  04/09/2020 FINDINGS: There is no evidence of fracture or dislocation. There is no evidence of arthropathy or other focal bone abnormality. Soft tissues are unremarkable. IMPRESSION: Negative. Electronically Signed   By: Lajean Manes M.D.   On: 03/15/2021 08:05    Procedures Procedures   Medications Ordered in ED Medications - No data to display  ED Course  I have reviewed the triage vital signs and the nursing notes.  Pertinent labs & imaging results that were available during my care of the patient were reviewed by me and considered in my medical decision making (see chart for details).    MDM Rules/Calculators/A&P                           35 year old male comes in with chief complaint of shoulder pain.  Pain with abduction into 2 weeks.  Minimal discomfort throughout the day.  Exam is reassuring.  No clicks, no laxity with abduction and internal and external rotation of the shoulder  I suspect that this could be bursitis or tendinitis.  I have advised aggressive RICE along with Mobic 15 mg with omeprazole and follow-up with orthopedic ports medicine doctor if not better in 2 weeks.  Final Clinical Impression(s) / ED Diagnoses Final diagnoses:  Subacromial bursitis of left shoulder joint    Rx / DC Orders ED Discharge Orders          Ordered    meloxicam (MOBIC) 15 MG tablet  Nightly        03/15/21 0830    omeprazole (PRILOSEC) 20 MG capsule  Nightly        03/15/21 0830             Varney Biles, MD 03/15/21 [phone]

## 2021-09-19 ENCOUNTER — Emergency Department (HOSPITAL_COMMUNITY)
Admission: EM | Admit: 2021-09-19 | Discharge: 2021-09-19 | Disposition: A | Discharge status: Home / Self Care | Payer: Medicaid Other | Attending: Emergency Medicine | Admitting: Emergency Medicine

## 2021-09-19 ENCOUNTER — Encounter (HOSPITAL_COMMUNITY): Payer: Self-pay | Admitting: Emergency Medicine

## 2021-09-19 DIAGNOSIS — Z5321 Procedure and treatment not carried out due to patient leaving prior to being seen by health care provider: Secondary | ICD-10-CM | POA: Insufficient documentation

## 2021-09-19 DIAGNOSIS — M542 Cervicalgia: Secondary | ICD-10-CM | POA: Insufficient documentation

## 2021-09-19 DIAGNOSIS — R220 Localized swelling, mass and lump, head: Secondary | ICD-10-CM | POA: Insufficient documentation

## 2021-09-19 NOTE — ED Provider Triage Note (Cosign Needed)
Emergency Medicine Provider Triage Evaluation Note  Glen Perry , a 36 y.o. male  was evaluated in triage.  Pt complains of neck pain.  Patient states he has a "sore spot" on the back of his neck that has not healed in over a week and a half.  He presents today with concern that it could be cancerous.  He is also complaining of intermittent dizziness.  Review of Systems  Positive: Dizziness, neck pain Negative: Fever, chills, falls, syncope  Physical Exam  BP (!) 135/102 (BP Location: Right Arm)    Pulse 62    Temp (!) 97.5 F (36.4 C) (Oral)    Resp 17    SpO2 100%  Gen:   Awake, no distress   Resp:  Normal effort  MSK:   Moves extremities without difficulty  Other:  No lesions appreciated to the back of the neck, full passive ROM of neck  Medical Decision Making  Medically screening exam initiated at 9:30 AM.  Appropriate orders placed.  Glen Perry was informed that the remainder of the evaluation will be completed by another provider, this initial triage assessment does not replace that evaluation, and the importance of remaining in the ED until their evaluation is complete.     Glen Perry, [place] 09/19/21 [id number]

## 2021-09-19 NOTE — ED Triage Notes (Signed)
Pt endorses small spot on back of head/neck for a week that is not healing.

## 2021-09-19 NOTE — ED Notes (Signed)
Pt left AMA. PA notified.  

## 2021-09-19 NOTE — ED Provider Notes (Signed)
Patient left without being seen he was moved in the EMR to a room however was never physically moved to this room and left before he could be seen.  I never assessed this patient.   Pati Gallo [place], [place] 09/19/21 1359    Wyvonnia Dusky, MD 09/19/21 1517

## 2021-12-31 ENCOUNTER — Emergency Department (HOSPITAL_COMMUNITY)
Admission: EM | Admit: 2021-12-31 | Discharge: 2021-12-31 | Disposition: A | Discharge status: Home / Self Care | Payer: Medicaid Other | Attending: Emergency Medicine | Admitting: Emergency Medicine

## 2021-12-31 DIAGNOSIS — R3121 Asymptomatic microscopic hematuria: Secondary | ICD-10-CM | POA: Insufficient documentation

## 2021-12-31 DIAGNOSIS — F17210 Nicotine dependence, cigarettes, uncomplicated: Secondary | ICD-10-CM | POA: Insufficient documentation

## 2021-12-31 DIAGNOSIS — R1030 Lower abdominal pain, unspecified: Secondary | ICD-10-CM | POA: Insufficient documentation

## 2021-12-31 LAB — URINALYSIS, ROUTINE W REFLEX MICROSCOPIC
Bacteria, UA: NONE SEEN
Bilirubin Urine: NEGATIVE
Glucose, UA: NEGATIVE mg/dL
Ketones, ur: NEGATIVE mg/dL
Leukocytes,Ua: NEGATIVE
Nitrite: NEGATIVE
Protein, ur: NEGATIVE mg/dL
Specific Gravity, Urine: 1.003 — ABNORMAL LOW (ref 1.005–1.030)
pH: 6 (ref 5.0–8.0)

## 2021-12-31 NOTE — ED Triage Notes (Signed)
Pt. Stated, I had a work physical and they found blood in my urine ?

## 2021-12-31 NOTE — ED Provider Notes (Signed)
?[facility] ?Provider Note ? ? ?CSN: [id number] ?Arrival date & time: 12/31/21  1028 ? ?  ? ?History ? ?Chief Complaint  ?Patient presents with  ? Hematuria  ? ? ?Glen Perry is a 36 y.o. male who presents the emergency department with a chief complaint of hematuria.  Patient states that he went in for a preemployment work physical and they found microscopic hematuria on his urinalysis.  He came here for further evaluation.  He states that he has a previous history of urinary tract infection and noticed that his urine was very foul-smelling a couple days ago.  He has also had some crampiness in his lower abdomen.  He denies flank pain, fever, chills, penile discharge.  He does not smoke cigarettes and does not use other tobacco products.  He has no other complaints at this time and has not noticed any gross hematuria ? ? ?Hematuria ? ? ?  ? ?Home Medications ?Prior to Admission medications   ?Medication Sig Start Date End Date Taking? Authorizing Provider  ?meloxicam (MOBIC) 15 MG tablet Take 1 tablet (15 mg total) by mouth at bedtime. 03/15/21   Varney Biles, MD  ?Multiple Vitamin (MULTIVITAMIN WITH MINERALS) TABS tablet Take 1 tablet by mouth daily. One-A-Day for Men    [provider]  ?omeprazole (PRILOSEC) 20 MG capsule Take 1 capsule (20 mg total) by mouth at bedtime. 03/15/21   Varney Biles, MD  ?   ? ?Allergies    ?Patient has no known allergies.   ? ?Review of Systems   ?Review of Systems  ?Genitourinary:  Positive for hematuria.  ? ?Physical Exam ?Updated Vital Signs ?BP (!) 130/96   Pulse 63   Temp 98.9 ?F (37.2 ?C) (Oral)   Resp 14   SpO2 99%  ?Physical Exam ?Vitals and nursing note reviewed.  ?Constitutional:   ?   General: He is not in acute distress. ?   Appearance: He is well-developed. He is not diaphoretic.  ?HENT:  ?   Head: Normocephalic and atraumatic.  ?Eyes:  ?   General: No scleral icterus. ?   Conjunctiva/sclera: Conjunctivae  normal.  ?Cardiovascular:  ?   Rate and Rhythm: Normal rate and regular rhythm.  ?   Heart sounds: Normal heart sounds.  ?Pulmonary:  ?   Effort: Pulmonary effort is normal. No respiratory distress.  ?   Breath sounds: Normal breath sounds.  ?Abdominal:  ?   Palpations: Abdomen is soft.  ?   Tenderness: There is no abdominal tenderness. There is no right CVA tenderness or left CVA tenderness.  ?Musculoskeletal:  ?   Cervical back: Normal range of motion and neck supple.  ?Skin: ?   General: Skin is warm and dry.  ?Neurological:  ?   Mental Status: He is alert.  ?Psychiatric:     ?   Behavior: Behavior normal.  ? ? ?ED Results / Procedures / Treatments   ?Labs ?(all labs ordered are listed, but only abnormal results are displayed) ?Labs Reviewed  ?URINALYSIS, ROUTINE W REFLEX MICROSCOPIC - Abnormal; Notable for the following components:  ?    Result Value  ? Color, Urine STRAW (*)   ? Specific Gravity, Urine 1.003 (*)   ? Hgb urine dipstick SMALL (*)   ? All other components within normal limits  ? ? ?EKG ?None ? ?Radiology ?No results found. ? ?Procedures ?Procedures  ? ? ?Medications Ordered in ED ?Medications - No data to display ? ?ED Course/  Medical Decision Making/ A&P ?Clinical Course as of 12/31/21 1247  ?Fri Dec 31, 2021  ?1243 Urinalysis, Routine w reflex microscopic Urine, Unspecified Source(!) ?UA shows positive hemoglobin with negative red blood cells.  This could be a little bit of myoglobin causing false positive.  Does not appear to be an emergent issue as patient has no complaint of muscle pain.  We will have him follow-up with urology [AH]  ?  ?Clinical Course User Index ?[AH] Margarita Mail, PA-C  ? ?                        ?Medical Decision Making ?36 year old male here with report of hemoglobin in his urine and a preemployment physical.  His UA is positive for hemoglobin but there are no red blood cells noted.  He may have a small amount of myoglobin causing false positive urine.  It is summer,  hot and he has been working hard.  He is not complaining of muscle pain suggestive of rhabdomyolysis.  We will have the patient continue to hydrate well.  Suggest follow-up with urology or repeat urinalysis in 1 week.  No evidence of infection.  Discussed outpatient follow-up and return precautions ? ?Amount and/or Complexity of Data Reviewed ?Labs: ordered. Decision-making details documented in ED Course. ? ? ? ? ? ? ? ? ? ? ?Final Clinical Impression(s) / ED Diagnoses ?Final diagnoses:  ?Asymptomatic microscopic hematuria  ? ? ?Rx / DC Orders ?ED Discharge Orders   ? ? None  ? ?  ? ? ?  ?Margarita Mail, PA-C ?12/31/21 1247 ? ?  ?Davonna Belling, MD ?01/01/22 1435 ? ?

## 2021-12-31 NOTE — Discharge Instructions (Signed)
Contact a health care provider if: ?You develop back pain. ?You have a fever or chills. ?You have nausea or vomiting. ?Your symptoms do not improve after 3 days. ?Your symptoms get worse. ?Get help right away if: ?You develop severe vomiting and are unable to take medicine without vomiting. ?You develop severe pain in your back or abdomen even though you are taking medicine. ?You pass a large amount of blood in your urine. ?You pass blood clots in your urine. ?You feel very weak or like you might faint. ?You faint. ?

## 2022-01-04 DIAGNOSIS — Z1388 Encounter for screening for disorder due to exposure to contaminants: Secondary | ICD-10-CM | POA: Diagnosis not present

## 2022-01-04 DIAGNOSIS — Z3009 Encounter for other general counseling and advice on contraception: Secondary | ICD-10-CM | POA: Diagnosis not present

## 2022-01-04 DIAGNOSIS — Z0389 Encounter for observation for other suspected diseases and conditions ruled out: Secondary | ICD-10-CM | POA: Diagnosis not present

## 2022-01-28 IMAGING — DX DG SHOULDER 2+V*L*
3 series · 3 of 3 positions shown · non-contrast
Comparison: 04/09/2020

CLINICAL DATA: Patient reports left superior shoulder pain since
December 2020. Reports felt something shift while in awkward position
trying to lift object. Now reports left AC joint sits higher than
his right. Painful to abduct left shoulder. Denies any prior injury
or surgery to left shoulder.

EXAM:
LEFT SHOULDER - 2+ VIEW

[shoulder grashey]
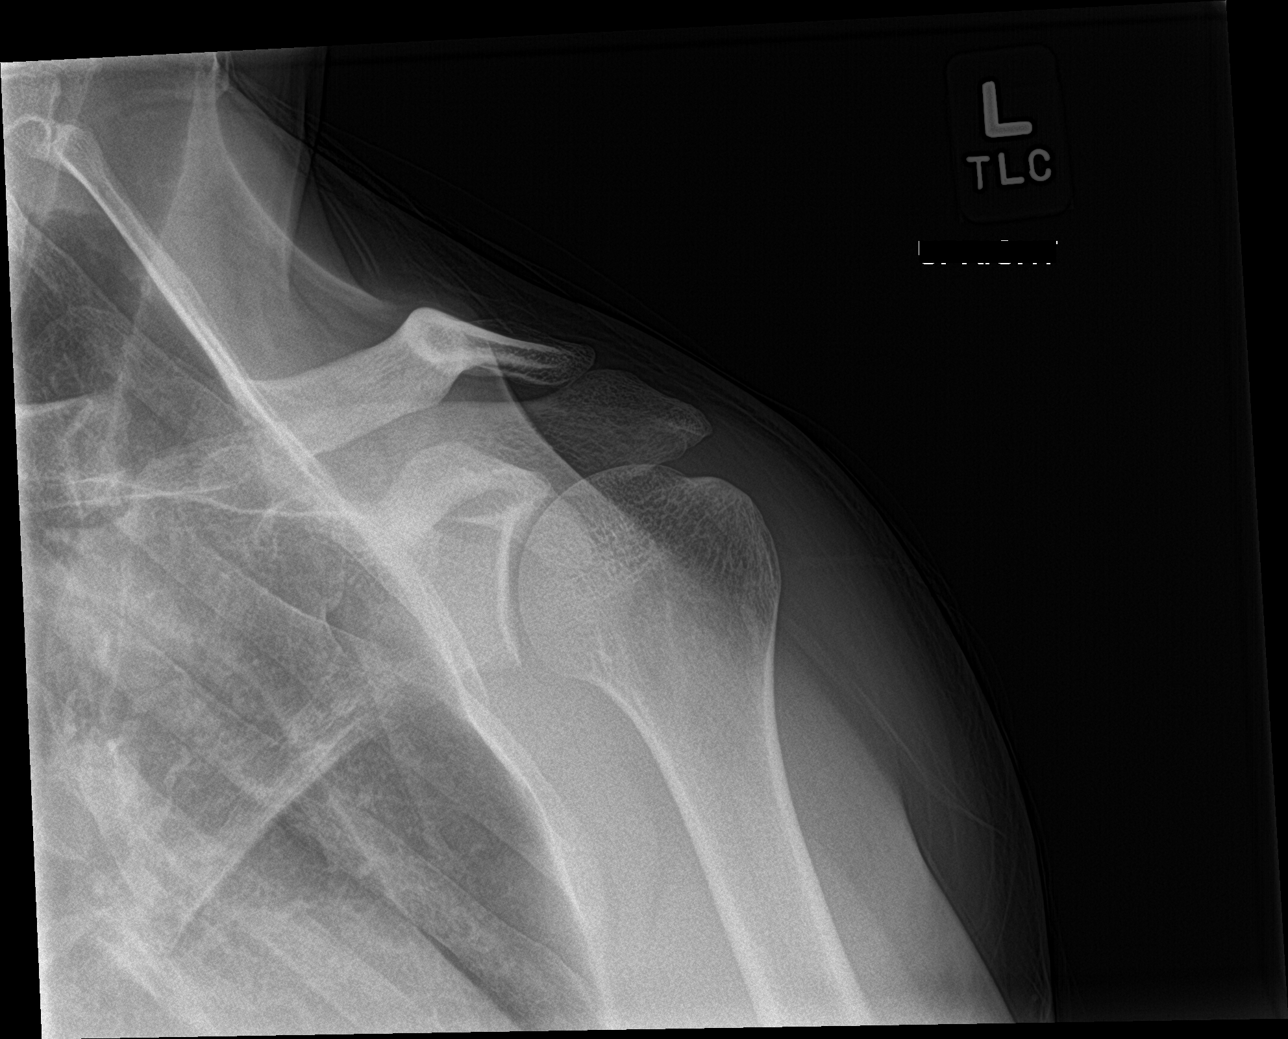

[shoulder y view]
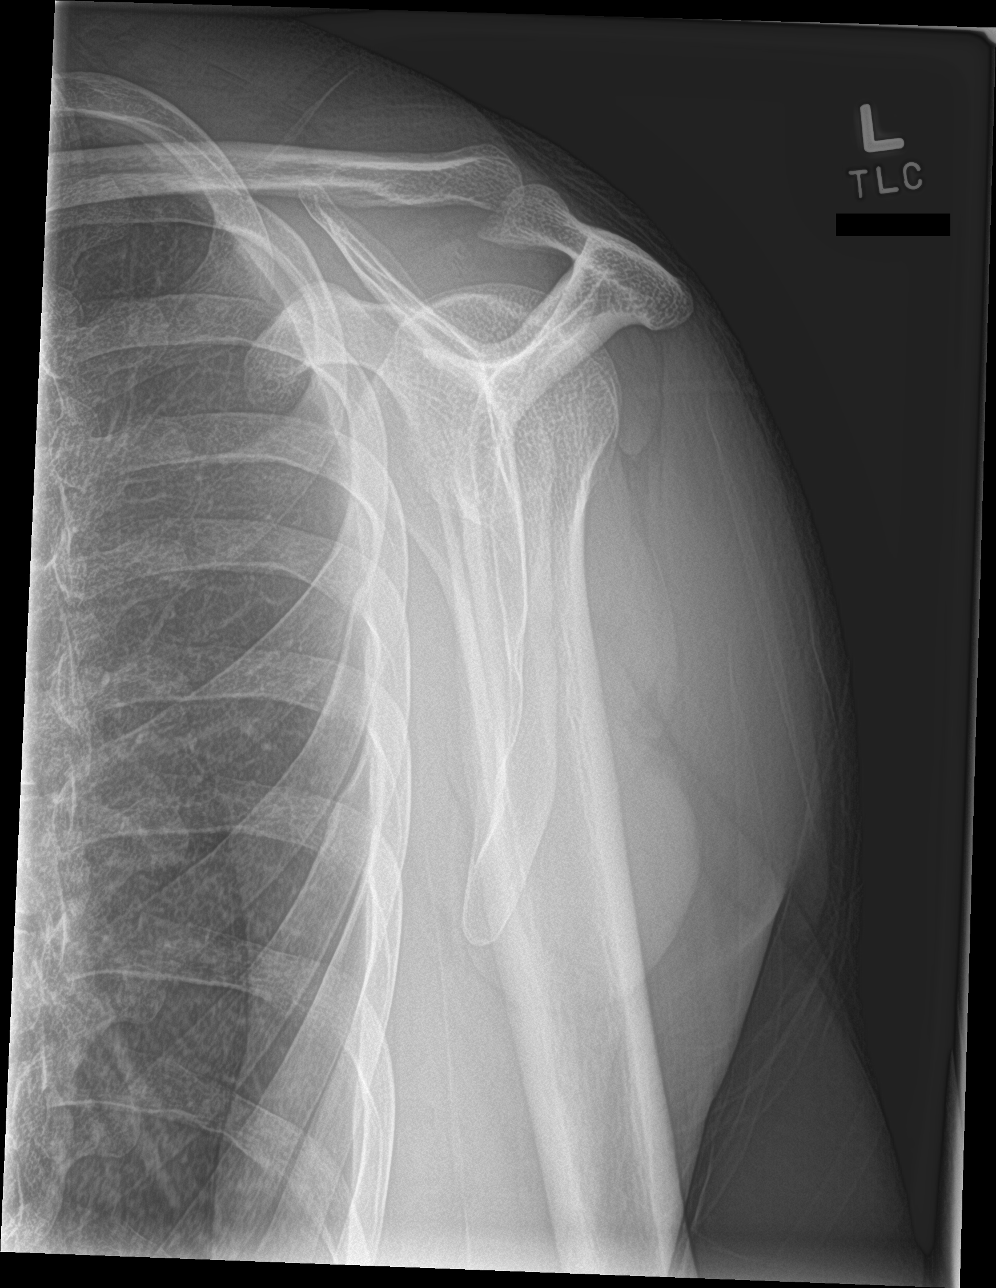

[shoulder axillary]
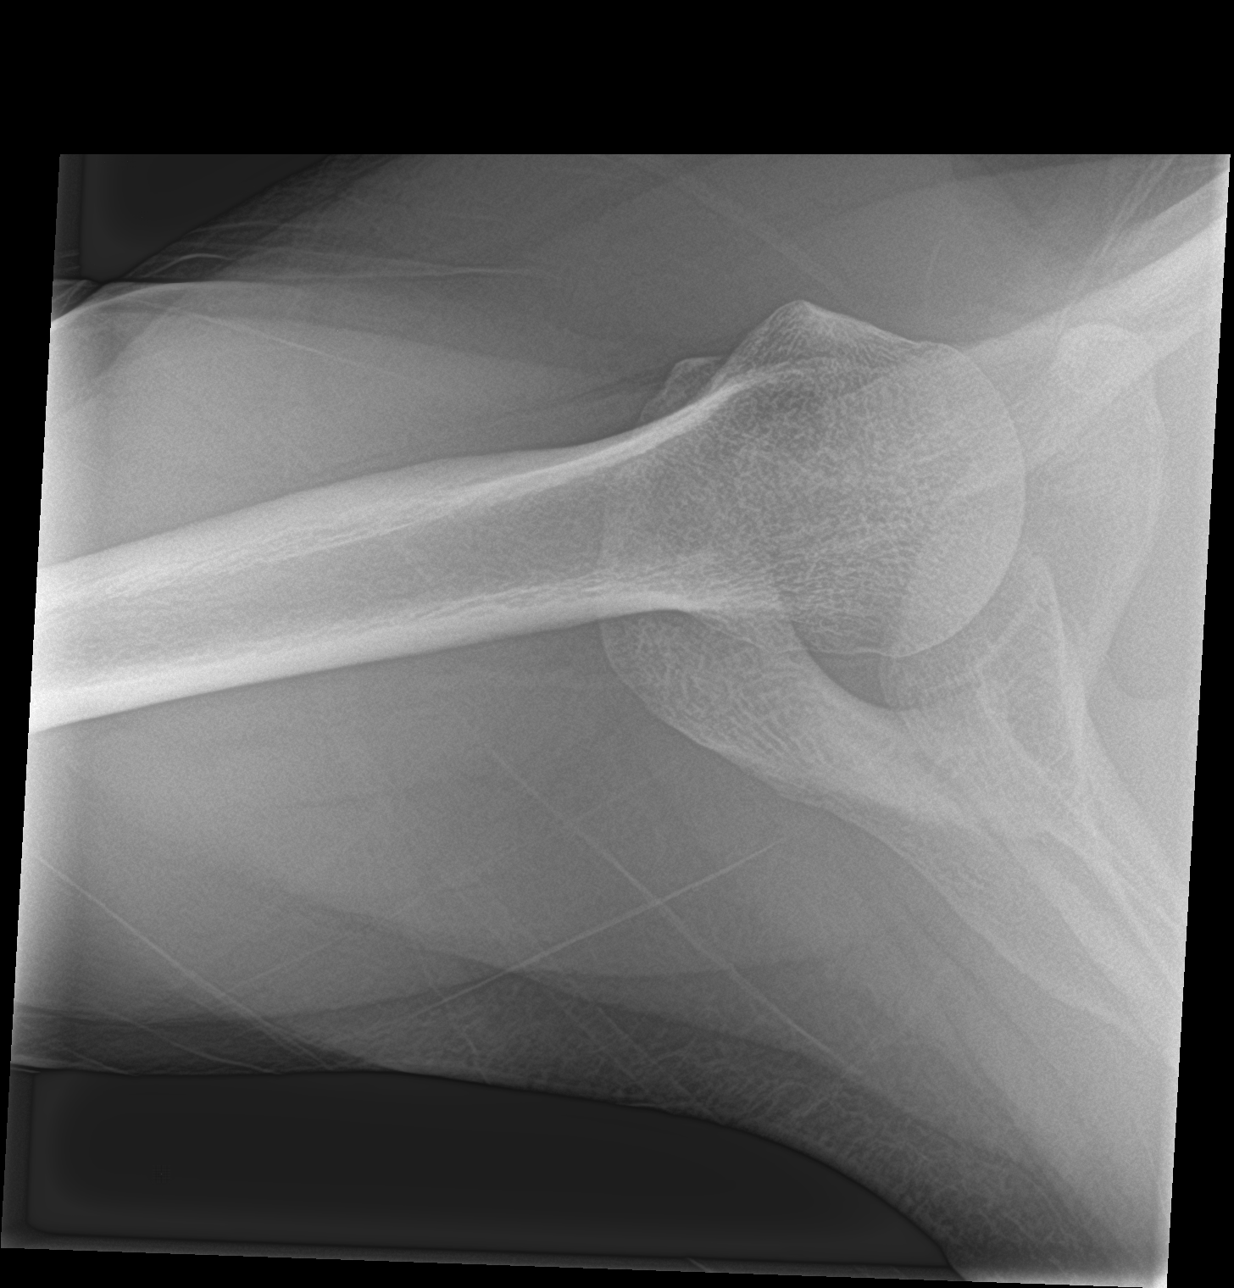

[3 of 3 positions shown; findings below may reference images not displayed]

FINDINGS: There is no evidence of fracture or dislocation. There is no
evidence of arthropathy or other focal bone abnormality. Soft
tissues are unremarkable.
IMPRESSION: Negative.

## 2022-02-06 ENCOUNTER — Emergency Department (HOSPITAL_COMMUNITY)
Admission: EM | Admit: 2022-02-06 | Discharge: 2022-02-06 | Disposition: A | Discharge status: Home / Self Care | Payer: Self-pay | Attending: Emergency Medicine | Admitting: Emergency Medicine

## 2022-02-06 ENCOUNTER — Emergency Department (HOSPITAL_COMMUNITY): Payer: Self-pay

## 2022-02-06 ENCOUNTER — Encounter (HOSPITAL_COMMUNITY): Payer: Self-pay | Admitting: Emergency Medicine

## 2022-02-06 ENCOUNTER — Other Ambulatory Visit: Payer: Self-pay

## 2022-02-06 DIAGNOSIS — G8911 Acute pain due to trauma: Secondary | ICD-10-CM | POA: Insufficient documentation

## 2022-02-06 DIAGNOSIS — M25561 Pain in right knee: Secondary | ICD-10-CM | POA: Insufficient documentation

## 2022-02-06 DIAGNOSIS — X501XXA Overexertion from prolonged static or awkward postures, initial encounter: Secondary | ICD-10-CM | POA: Insufficient documentation

## 2022-02-06 NOTE — ED Notes (Signed)
Pt verbalizes understanding of discharge instructions. Pt refused crutches, stating he didn't think he needed them. Opportunity for questions and answers were provided. Pt discharged from the ED.

## 2022-02-06 NOTE — ED Triage Notes (Signed)
Pt reports right knee pain, concern for fluid on the knee, may have injured while exercising x 1.5 weeks ago, no relief with ibuprofen

## 2022-02-06 NOTE — Discharge Instructions (Signed)
Use knee immobilizer and crutches to help support the knee.  Your x-ray today does not show any fracture, does show a very small effusion.  Continue using ibuprofen.  Please call to schedule follow-up appointment with Dr. Mardelle Matte with orthopedics for further evaluation of your knee pain.

## 2022-02-06 NOTE — ED Provider Notes (Signed)
[facility] EMERGENCY DEPARTMENT Provider Note   CSN: [id number] Arrival date & time: 02/06/22  6761     History  Chief Complaint  Patient presents with   Knee Pain    Glen Perry is a 36 y.o. male.  Glen Perry is a 36 y.o. male who is otherwise healthy, presents to the emergency department for evaluation of knee pain.  Patient reports about 1.5 weeks ago he was exercising in the gym and one of the weight machines malfunctioned causing him to make a sudden twisting.  He reports since then he has had pain in his right knee that is primarily worse with standing and certain movements.  He is also noticed a very small amount of fluid accumulating on the medial aspect of the knee.  He has been taking ibuprofen and thought that this would resolve but he has continued to have discomfort and small amount of swelling so came in for evaluation.  No overlying redness or warmth.  No fevers or chills.  Patient is able to walk and bend the knee with some discomfort.  The history is provided by the patient.      Home Medications Prior to Admission medications   Medication Sig Start Date End Date Taking? Authorizing Provider  meloxicam (MOBIC) 15 MG tablet Take 1 tablet (15 mg total) by mouth at bedtime. 03/15/21   Varney Biles, MD  Multiple Vitamin (MULTIVITAMIN WITH MINERALS) TABS tablet Take 1 tablet by mouth daily. One-A-Day for Men    [provider]  omeprazole (PRILOSEC) 20 MG capsule Take 1 capsule (20 mg total) by mouth at bedtime. 03/15/21   Varney Biles, MD      Allergies    Patient has no known allergies.    Review of Systems   Review of Systems  Constitutional:  Negative for chills and fever.  Musculoskeletal:  Positive for arthralgias and joint swelling.  Neurological:  Negative for dizziness, syncope and light-headedness.   Physical Exam Updated Vital Signs BP 126/84 (BP Location: Right Arm)   Pulse (!) 50   Temp 98.3  F (36.8 C)   Resp 17   Ht '5\' 11"'$  (1.803 m)   Wt 85.3 kg   SpO2 100%   BMI 26.22 kg/m  Physical Exam Vitals and nursing note reviewed.  Constitutional:      General: He is not in acute distress.    Appearance: Normal appearance. He is well-developed. He is not ill-appearing or diaphoretic.  HENT:     Head: Normocephalic and atraumatic.  Eyes:     General:        Right eye: No discharge.        Left eye: No discharge.  Pulmonary:     Effort: Pulmonary effort is normal. No respiratory distress.  Musculoskeletal:     Comments: Tenderness over the right knee with very slight effusion noted over the medial aspect, no overlying erythema or warmth, full range of motion with minimal discomfort.  Patient does have mild discomfort with complete straightening of the knee.  No significant pain or laxity with varus or valgus stress, no laxity with Lachman's.  No tenderness or swelling distal to the knee or proximally.  Distal pulses 2+  Skin:    General: Skin is warm and dry.  Neurological:     Mental Status: He is alert and oriented to person, place, and time.     Coordination: Coordination normal.  Psychiatric:        Mood  and Affect: Mood normal.        Behavior: Behavior normal.    ED Results / Procedures / Treatments   Labs (all labs ordered are listed, but only abnormal results are displayed) Labs Reviewed - No data to display  EKG None  Radiology DG Knee Complete 4 Views Right  Result Date: 02/06/2022 CLINICAL DATA:  36 year old male acute RIGHT knee pain following injury 1 week ago. EXAM: RIGHT KNEE - COMPLETE 4 VIEW COMPARISON:  None Available. FINDINGS: There is no evidence of acute fracture or dislocation. Small knee effusion is present. The joint spaces are otherwise unremarkable. No bony lesions are present. IMPRESSION: Small knee effusion without acute bony abnormality. Electronically Signed   By: Margarette Canada M.D.   On: 02/06/2022 07:23    Procedures Procedures     Medications Ordered in ED Medications - No data to display  ED Course/ Medical Decision Making/ A&P                           Medical Decision Making Amount and/or Complexity of Data Reviewed Radiology: ordered.   36 y.o. male presents to the ED with complaints of right knee pain, this involves an extensive number of treatment options, and is a complaint that carries with it a high risk of complications and morbidity.  The differential diagnosis includes fracture, dislocation, internal derangement, inflammatory effusion, septic arthritis   On arrival pt is nontoxic, vitals stable. Exam significant for very slight effusion, full range of motion, no concern for septic arthritis, no significant joint laxity.  Previous records obtained and reviewed  Imaging Studies ordered:  I ordered imaging studies which included right knee x-ray, I independently visualized and interpreted imaging which showed mild effusion, no fracture  ED Course:   I discussed reassuring x-rays with patient.  I does have 1 mild effusion I suspect this is likely inflammatory or due to some internal derangement of the knee.  Given the injury with twisting motion will place in knee immobilizer and provided with crutches.  Patient has been given instructions to follow-up with, Dr. Mardelle Matte with orthopedics for further evaluation of his knee pain and to continue using NSAIDs to help with pain and inflammation. Plan  At this time there does not appear to be any evidence of an acute emergency medical condition requiring further emergent evaluation and the patient appears stable for discharge with appropriate outpatient follow up. Diagnosis and return precautions discussed with patient who verbalizes understanding and is agreeable to discharge.   Admission considered but deemed unnecessary without evidence of significant traumatic injury to the knee requiring emergent surgery.  Portions of this note were generated with  Lobbyist. Dictation errors may occur despite best attempts at proofreading.         Final Clinical Impression(s) / ED Diagnoses Final diagnoses:  Acute pain of right knee    Rx / DC Orders ED Discharge Orders     None         Janet Berlin 02/06/22 0749    Noemi Chapel, MD 02/08/22 [phone]

## 2022-02-23 ENCOUNTER — Ambulatory Visit: Payer: Self-pay | Admitting: Surgery

## 2022-02-23 NOTE — H&P (Signed)
Subjective:  CC: Non-recurrent unilateral inguinal hernia without obstruction or gangrene [K40.90]  HPI:  Glen Perry is a 36 y.o. male who returns for evaluation of above. Symptoms were first noted several years ago. Asymptomatic, but does not like way they look, slig     Mass on arms he will like to be excised if possible.    Past Medical History:  has a past medical history of GERD (gastroesophageal reflux disease) and Hernia, inguinal, bilateral.  Past Surgical History: No past surgical history on file.  Family History: family history is not on file.  Social History:  reports that he has never smoked. He has never used smokeless tobacco. He reports that he does not drink alcohol and does not use drugs.  Current Medications: has a current medication list which includes the following prescription(s): meloxicam and cyclobenzaprine.  Allergies:  Allergies as of 02/22/2022  (No Known Allergies)   ROS:  A 15 point review of systems was performed and pertinent positives and negatives noted in HPI   Objective:    BP 127/83   Pulse 55   Ht 180.3 cm ('5\' 11"'$ )   Wt 85.3 kg (188 lb)   BMI 26.22 kg/m   Constitutional :  alert, appears stated age, cooperative and no distress Lymphatics/Throat:  no asymmetry, masses, or scars Respiratory:  clear to auscultation bilaterally Cardiovascular:  regular rate and rhythm Gastrointestinal: soft, non-tender; bowel sounds normal; no masses,  no organomegaly. inguinal hernia noted.  small, reducible, no overlying skin changes and bilateral Musculoskeletal: Steady gait and movement Skin: Cool and moist, three lipomas in right arm.  Ulnar aspect towards wrist, palmar surface, mid forearm, and triceps area. Psychiatric: Normal affect, non-agitated, not confused     LABS:  n/a   RADS: n/a Assessment:      Non-recurrent unilateral inguinal hernia without obstruction or gangrene [K40.90]   Right arm lipoma x3  Plan:    1.  Non-recurrent unilateral inguinal hernia without obstruction or gangrene [K40.90]   Discussed the risk of surgery including recurrence, which can be up to 50% in the case of incisional or complex hernias, possible use of prosthetic materials (mesh) and the increased risk of mesh infxn if used, bleeding, chronic pain, post-op infxn, post-op SBO or ileus, and possible re-operation to address said risks. The risks of general anesthetic, if used, includes MI, CVA, sudden death or even reaction to anesthetic medications also discussed. Alternatives include continued observation.  Benefits include possible symptom relief, prevention of incarceration, strangulation, enlargement in size over time, and the risk of emergency surgery in the face of strangulation.   Typical post-op recovery time of 3-5 days with 2 weeks of activity restrictions were also discussed.  ED return precautions given for sudden increase in pain, size of hernia with accompanying fever, nausea, and/or vomiting.  The patient verbalized understanding and all questions were answered to the patient's satisfaction.   2. Patient has elected to proceed with surgical treatment. Procedure will be scheduled.  Written consent was obtained.Marland Kitchen BILATERAL, robotic assisted laparoscopic  Arm lipoma removal at same time

## 2022-02-25 ENCOUNTER — Inpatient Hospital Stay: Admission: RE | Admit: 2022-02-25 | Payer: Medicaid Other | Source: Ambulatory Visit

## 2022-02-28 ENCOUNTER — Encounter
Admission: RE | Admit: 2022-02-28 | Discharge: 2022-02-28 | Disposition: A | Discharge status: Home / Self Care | Payer: 59 | Source: Ambulatory Visit | Attending: Surgery | Admitting: Surgery

## 2022-02-28 ENCOUNTER — Other Ambulatory Visit: Payer: Self-pay

## 2022-02-28 NOTE — Patient Instructions (Addendum)
Your procedure is scheduled on: 03/04/2022  Report to the Registration Desk on the 1st floor of the [facility]. To find out your arrival time, please call [phone] between 1PM - 3PM on:  03/03/2022  If your arrival time is 6:00 am, do not arrive prior to that time as the [facility] entrance doors do not open until 6:00 am.  REMEMBER: Instructions that are not followed completely may result in serious medical risk, up to and including death; or upon the discretion of your surgeon and anesthesiologist your surgery may need to be rescheduled.  Do not eat food after midnight the night before surgery.  No gum chewing, lozengers or hard candies.  You may however, drink CLEAR liquids up to 2 hours before you are scheduled to arrive for your surgery. Do not drink anything within 2 hours of your scheduled arrival time.  Clear liquids include: - water  - apple juice without pulp - gatorade (not RED colors) - Do NOT drink anything that is not on this list.   One week prior to surgery: Stop Anti-inflammatories (NSAIDS) such as Advil, Aleve, Ibuprofen, Motrin, Naproxen, Naprosyn and Aspirin based products such as Excedrin, Goodys Powder, BC Powder and mobic Stop ANY OVER THE COUNTER supplements until after surgery like multivitamin You may however, continue to take Tylenol if needed for pain up until the day of surgery.  No Alcohol for 24 hours before or after surgery.  No Smoking including e-cigarettes for 24 hours prior to surgery.  No chewable tobacco products for at least 6 hours prior to surgery.  No nicotine patches on the day of surgery.  Do not use any "recreational" drugs for at least a week prior to your surgery.  Please be advised that the combination of cocaine and anesthesia may have negative outcomes, up to and including death. If you test positive for cocaine, your surgery will be cancelled.  On the morning of surgery brush your teeth with toothpaste and water, you may  rinse your mouth with mouthwash if you wish. Do not swallow any toothpaste or mouthwash.  Use CHG Soaps directed on instruction sheet.  Do not wear jewelry, make-up, hairpins, clips or nail polish.  Do not wear lotions, powders, or perfumes.   Do not shave body from the neck down 48 hours prior to surgery just in case you cut yourself which could leave a site for infection.  Also, freshly shaved skin may become irritated if using the CHG soap.  Contact lenses, hearing aids and dentures may not be worn into surgery.  Do not bring valuables to the hospital. [facility] is not responsible for any missing/lost belongings or valuables.    Notify your doctor if there is any change in your medical condition (cold, fever, infection).  Wear comfortable clothing (specific to your surgery type) to the hospital.  After surgery, you can help prevent lung complications by doing breathing exercises.  Take deep breaths and cough every 1-2 hours. Your doctor may order a device called an Incentive Spirometer to help you take deep breaths. If you are being admitted to the hospital overnight, leave your suitcase in the car. After surgery it may be brought to your room.  If you are being discharged the day of surgery, you will not be allowed to drive home. You will need a responsible adult (18 years or older) to drive you home and stay with you that night.   If you are taking public transportation, you will need to  have a responsible adult (18 years or older) with you. Please confirm with your physician that it is acceptable to use public transportation.   Please call the [facility] at [phone] if you have any questions about these instructions.  Surgery Visitation Policy:  Patients undergoing a surgery or procedure may have two family members or support persons with them as long as the person is not COVID-19 positive or experiencing its symptoms.   Inpatient Visitation:     Visiting hours are 7 a.m. to 8 p.m. Up to four visitors are allowed at one time in a patient room, including children. The visitors may rotate out with other people during the day. One designated support person (adult) may remain overnight.

## 2022-03-03 MED ORDER — ORAL CARE MOUTH RINSE: 15.0000 mL | Freq: Once | OROMUCOSAL | Status: AC

## 2022-03-03 MED ORDER — GABAPENTIN 300 MG PO CAPS: 300.0000 mg | ORAL_CAPSULE | ORAL | Status: AC

## 2022-03-03 MED ORDER — LACTATED RINGERS IV SOLN: INTRAVENOUS | Status: DC

## 2022-03-03 MED ORDER — CHLORHEXIDINE GLUCONATE 0.12 % MT SOLN: 15.0000 mL | Freq: Once | OROMUCOSAL | Status: AC

## 2022-03-03 MED ORDER — CELECOXIB 200 MG PO CAPS: 200.0000 mg | ORAL_CAPSULE | ORAL | Status: AC

## 2022-03-03 MED ORDER — FAMOTIDINE 20 MG PO TABS: 20.0000 mg | ORAL_TABLET | Freq: Once | ORAL | Status: AC

## 2022-03-03 MED ORDER — ACETAMINOPHEN 500 MG PO TABS: 1000.0000 mg | ORAL_TABLET | ORAL | Status: AC

## 2022-03-03 MED ORDER — CHLORHEXIDINE GLUCONATE CLOTH 2 % EX PADS
6.0000 | MEDICATED_PAD | Freq: Once | CUTANEOUS | Status: AC
  Administered 2022-03-04: 6 via TOPICAL

## 2022-03-03 MED ORDER — CEFAZOLIN SODIUM-DEXTROSE 2-4 GM/100ML-% IV SOLN
2.0000 g | INTRAVENOUS | Status: AC
  Administered 2022-03-04: 2 g via INTRAVENOUS

## 2022-03-04 ENCOUNTER — Encounter: Payer: Self-pay | Admitting: Surgery

## 2022-03-04 ENCOUNTER — Encounter
Admission: RE | Disposition: A | Discharge status: Home / Self Care | Payer: Self-pay | Source: Home / Self Care | Attending: Surgery

## 2022-03-04 ENCOUNTER — Ambulatory Visit
Admission: RE | Admit: 2022-03-04 | Discharge: 2022-03-04 | Disposition: A | Discharge status: Home / Self Care | Payer: 59 | Attending: Surgery | Admitting: Surgery

## 2022-03-04 ENCOUNTER — Other Ambulatory Visit: Payer: Self-pay

## 2022-03-04 ENCOUNTER — Ambulatory Visit: Payer: 59 | Admitting: Certified Registered"

## 2022-03-04 DIAGNOSIS — D1721 Benign lipomatous neoplasm of skin and subcutaneous tissue of right arm: Secondary | ICD-10-CM | POA: Insufficient documentation

## 2022-03-04 DIAGNOSIS — K4020 Bilateral inguinal hernia, without obstruction or gangrene, not specified as recurrent: Secondary | ICD-10-CM | POA: Insufficient documentation

## 2022-03-04 HISTORY — PX: INSERTION OF MESH: SHX5868

## 2022-03-04 HISTORY — PX: LIPOMA EXCISION: SHX5283

## 2022-03-04 SURGERY — REPAIR, HERNIA, INGUINAL, BILATERAL, ROBOT-ASSISTED
Anesthesia: General | Site: Inguinal | Laterality: Right

## 2022-03-04 MED ORDER — FENTANYL CITRATE (PF) 100 MCG/2ML IJ SOLN
INTRAMUSCULAR | Status: AC
  Filled 2022-03-04: qty 2

## 2022-03-04 MED ORDER — PROPOFOL 10 MG/ML IV BOLUS
INTRAVENOUS | Status: AC
  Filled 2022-03-04: qty 20

## 2022-03-04 MED ORDER — MIDAZOLAM HCL 2 MG/2ML IJ SOLN
INTRAMUSCULAR | Status: AC
  Filled 2022-03-04: qty 2

## 2022-03-04 MED ORDER — DOCUSATE SODIUM 100 MG PO CAPS: 100.0000 mg | ORAL_CAPSULE | Freq: Two times a day (BID) | ORAL | 0 refills | Status: AC | PRN

## 2022-03-04 MED ORDER — LIDOCAINE HCL (PF) 1 % IJ SOLN
INTRAMUSCULAR | Status: AC
  Filled 2022-03-04: qty 30

## 2022-03-04 MED ORDER — 0.9 % SODIUM CHLORIDE (POUR BTL) OPTIME
TOPICAL | Status: DC | PRN
  Administered 2022-03-04: 500 mL

## 2022-03-04 MED ORDER — HYDROCODONE-ACETAMINOPHEN 5-325 MG PO TABS: 1.0000 | ORAL_TABLET | Freq: Four times a day (QID) | ORAL | 0 refills | Status: AC | PRN

## 2022-03-04 MED ORDER — FAMOTIDINE 20 MG PO TABS
ORAL_TABLET | ORAL | Status: AC
  Administered 2022-03-04: 20 mg via ORAL
  Filled 2022-03-04: qty 1

## 2022-03-04 MED ORDER — PHENYLEPHRINE HCL (PRESSORS) 10 MG/ML IV SOLN
INTRAVENOUS | Status: DC | PRN
  Administered 2022-03-04: 80 ug via INTRAVENOUS

## 2022-03-04 MED ORDER — FENTANYL CITRATE (PF) 100 MCG/2ML IJ SOLN
25.0000 ug | INTRAMUSCULAR | Status: DC | PRN
  Administered 2022-03-04: 25 ug via INTRAVENOUS

## 2022-03-04 MED ORDER — PROPOFOL 500 MG/50ML IV EMUL
INTRAVENOUS | Status: DC | PRN
  Administered 2022-03-04: 200 mg via INTRAVENOUS

## 2022-03-04 MED ORDER — GABAPENTIN 300 MG PO CAPS
ORAL_CAPSULE | ORAL | Status: AC
  Administered 2022-03-04: 300 mg via ORAL
  Filled 2022-03-04: qty 1

## 2022-03-04 MED ORDER — ONDANSETRON HCL 4 MG/2ML IJ SOLN
INTRAMUSCULAR | Status: DC | PRN
  Administered 2022-03-04: 4 mg via INTRAVENOUS

## 2022-03-04 MED ORDER — BUPIVACAINE-EPINEPHRINE (PF) 0.5% -1:200000 IJ SOLN
INTRAMUSCULAR | Status: AC
  Filled 2022-03-04: qty 30

## 2022-03-04 MED ORDER — LIDOCAINE HCL (CARDIAC) PF 100 MG/5ML IV SOSY
PREFILLED_SYRINGE | INTRAVENOUS | Status: DC | PRN
  Administered 2022-03-04: 100 mg via INTRAVENOUS

## 2022-03-04 MED ORDER — BUPIVACAINE-EPINEPHRINE 0.5% -1:200000 IJ SOLN
INTRAMUSCULAR | Status: DC | PRN
  Administered 2022-03-04: 16 mL

## 2022-03-04 MED ORDER — ACETAMINOPHEN 500 MG PO TABS
ORAL_TABLET | ORAL | Status: AC
  Administered 2022-03-04: 1000 mg via ORAL
  Filled 2022-03-04: qty 2

## 2022-03-04 MED ORDER — FENTANYL CITRATE (PF) 100 MCG/2ML IJ SOLN
INTRAMUSCULAR | Status: DC | PRN
  Administered 2022-03-04 (×2): 50 ug via INTRAVENOUS

## 2022-03-04 MED ORDER — CHLORHEXIDINE GLUCONATE 0.12 % MT SOLN
OROMUCOSAL | Status: AC
  Administered 2022-03-04: 15 mL via OROMUCOSAL
  Filled 2022-03-04: qty 15

## 2022-03-04 MED ORDER — BUPIVACAINE LIPOSOME 1.3 % IJ SUSP
INTRAMUSCULAR | Status: AC
  Filled 2022-03-04: qty 20

## 2022-03-04 MED ORDER — DEXMEDETOMIDINE HCL IN NACL 200 MCG/50ML IV SOLN
INTRAVENOUS | Status: DC | PRN
  Administered 2022-03-04 (×3): 4 ug via INTRAVENOUS
  Administered 2022-03-04: 8 ug via INTRAVENOUS

## 2022-03-04 MED ORDER — PROMETHAZINE HCL 25 MG/ML IJ SOLN: 6.2500 mg | INTRAMUSCULAR | Status: DC | PRN

## 2022-03-04 MED ORDER — ROCURONIUM BROMIDE 10 MG/ML (PF) SYRINGE
PREFILLED_SYRINGE | INTRAVENOUS | Status: AC
  Filled 2022-03-04: qty 10

## 2022-03-04 MED ORDER — MIDAZOLAM HCL 2 MG/2ML IJ SOLN
INTRAMUSCULAR | Status: DC | PRN
  Administered 2022-03-04: 2 mg via INTRAVENOUS

## 2022-03-04 MED ORDER — ROCURONIUM BROMIDE 100 MG/10ML IV SOLN
INTRAVENOUS | Status: DC | PRN
  Administered 2022-03-04: 10 mg via INTRAVENOUS
  Administered 2022-03-04: 60 mg via INTRAVENOUS
  Administered 2022-03-04: 10 mg via INTRAVENOUS

## 2022-03-04 MED ORDER — LIDOCAINE HCL 1 % IJ SOLN
INTRAMUSCULAR | Status: DC | PRN
  Administered 2022-03-04: 7 mL

## 2022-03-04 MED ORDER — CELECOXIB 200 MG PO CAPS
ORAL_CAPSULE | ORAL | Status: AC
  Administered 2022-03-04: 200 mg via ORAL
  Filled 2022-03-04: qty 1

## 2022-03-04 MED ORDER — CEFAZOLIN SODIUM-DEXTROSE 2-4 GM/100ML-% IV SOLN
INTRAVENOUS | Status: AC
  Filled 2022-03-04: qty 100

## 2022-03-04 MED ORDER — ACETAMINOPHEN 325 MG PO TABS: 650.0000 mg | ORAL_TABLET | Freq: Three times a day (TID) | ORAL | 0 refills | Status: AC | PRN

## 2022-03-04 MED ORDER — DEXAMETHASONE SODIUM PHOSPHATE 10 MG/ML IJ SOLN
INTRAMUSCULAR | Status: DC | PRN
  Administered 2022-03-04: 10 mg via INTRAVENOUS

## 2022-03-04 MED ORDER — IBUPROFEN 800 MG PO TABS: 800.0000 mg | ORAL_TABLET | Freq: Three times a day (TID) | ORAL | 0 refills | Status: AC | PRN

## 2022-03-04 MED ORDER — LIDOCAINE HCL (PF) 2 % IJ SOLN
INTRAMUSCULAR | Status: AC
  Filled 2022-03-04: qty 5

## 2022-03-04 MED ORDER — SUGAMMADEX SODIUM 200 MG/2ML IV SOLN
INTRAVENOUS | Status: DC | PRN
  Administered 2022-03-04: 200 mg via INTRAVENOUS

## 2022-03-04 MED ORDER — PHENYLEPHRINE 80 MCG/ML (10ML) SYRINGE FOR IV PUSH (FOR BLOOD PRESSURE SUPPORT)
PREFILLED_SYRINGE | INTRAVENOUS | Status: AC
  Filled 2022-03-04: qty 10

## 2022-03-04 MED ORDER — BUPIVACAINE-EPINEPHRINE 0.5% -1:200000 IJ SOLN
INTRAMUSCULAR | Status: DC | PRN
  Administered 2022-03-04: 34 mL

## 2022-03-04 SURGICAL SUPPLY — 70 items
ADH SKN CLS APL DERMABOND .7 (GAUZE/BANDAGES/DRESSINGS) ×3
APL PRP STRL LF DISP 70% ISPRP (MISCELLANEOUS) ×3
BAG PRESSURE INF REUSE 1000 (BAG) IMPLANT
BLADE SURG 15 STRL LF DISP TIS (BLADE) ×3 IMPLANT
BLADE SURG 15 STRL SS (BLADE) ×4
BLADE SURG SZ11 CARB STEEL (BLADE) ×4 IMPLANT
BNDG GAUZE DERMACEA FLUFF (GAUZE/BANDAGES/DRESSINGS) ×1
BNDG GAUZE DERMACEA FLUFF 4 (GAUZE/BANDAGES/DRESSINGS) ×3 IMPLANT
BNDG GZE DERMACEA 4 6PLY (GAUZE/BANDAGES/DRESSINGS) ×3
CHLORAPREP W/TINT 26 (MISCELLANEOUS) ×4 IMPLANT
COVER TIP SHEARS 8 DVNC (MISCELLANEOUS) ×3 IMPLANT
COVER TIP SHEARS 8MM DA VINCI (MISCELLANEOUS) ×1
COVER WAND RF STERILE (DRAPES) ×4 IMPLANT
DERMABOND ADVANCED (GAUZE/BANDAGES/DRESSINGS) ×1
DERMABOND ADVANCED .7 DNX12 (GAUZE/BANDAGES/DRESSINGS) ×3 IMPLANT
DRAPE 3/4 80X56 (DRAPES) ×4 IMPLANT
DRAPE ARM DVNC X/XI (DISPOSABLE) ×9 IMPLANT
DRAPE COLUMN DVNC XI (DISPOSABLE) ×3 IMPLANT
DRAPE DA VINCI XI ARM (DISPOSABLE) ×3
DRAPE DA VINCI XI COLUMN (DISPOSABLE) ×1
DRAPE LAPAROTOMY 100X77 ABD (DRAPES) ×4 IMPLANT
ELECT CAUTERY BLADE 6.4 (BLADE) ×4 IMPLANT
ELECT REM PT RETURN 9FT ADLT (ELECTROSURGICAL) ×4
ELECTRODE REM PT RTRN 9FT ADLT (ELECTROSURGICAL) ×3 IMPLANT
GAUZE 4X4 16PLY ~~LOC~~+RFID DBL (SPONGE) ×4 IMPLANT
GLOVE BIOGEL PI IND STRL 7.0 (GLOVE) ×6 IMPLANT
GLOVE BIOGEL PI INDICATOR 7.0 (GLOVE) ×2
GLOVE SURG SYN 6.5 ES PF (GLOVE) ×8 IMPLANT
GLOVE SURG SYN 6.5 PF PI (GLOVE) ×6 IMPLANT
GOWN STRL REUS W/ TWL LRG LVL3 (GOWN DISPOSABLE) ×9 IMPLANT
GOWN STRL REUS W/TWL LRG LVL3 (GOWN DISPOSABLE) ×12
IRRIGATOR SUCT 8 DISP DVNC XI (IRRIGATION / IRRIGATOR) IMPLANT
IRRIGATOR SUCTION 8MM XI DISP (IRRIGATION / IRRIGATOR)
IV NS 1000ML (IV SOLUTION)
IV NS 1000ML BAXH (IV SOLUTION) IMPLANT
KIT TURNOVER KIT A (KITS) ×4 IMPLANT
LABEL OR SOLS (LABEL) ×4 IMPLANT
MANIFOLD NEPTUNE II (INSTRUMENTS) ×4 IMPLANT
MESH 3DMAX 4X6 LT LRG (Mesh General) ×1 IMPLANT
MESH 3DMAX 4X6 RT LRG (Mesh General) ×1 IMPLANT
MESH 3DMAX MID 4X6 LT LRG (Mesh General) IMPLANT
MESH 3DMAX MID 4X6 RT LRG (Mesh General) IMPLANT
NDL INSUFFLATION 14GA 120MM (NEEDLE) ×3 IMPLANT
NEEDLE HYPO 22GX1.5 SAFETY (NEEDLE) ×4 IMPLANT
NEEDLE INSUFFLATION 14GA 120MM (NEEDLE) ×4 IMPLANT
NS IRRIG 1000ML POUR BTL (IV SOLUTION) ×4 IMPLANT
OBTURATOR OPTICAL STANDARD 8MM (TROCAR) ×1
OBTURATOR OPTICAL STND 8 DVNC (TROCAR) ×3
OBTURATOR OPTICALSTD 8 DVNC (TROCAR) ×3 IMPLANT
PACK BASIN MINOR ARMC (MISCELLANEOUS) ×3 IMPLANT
PACK LAP CHOLECYSTECTOMY (MISCELLANEOUS) ×4 IMPLANT
PENCIL ELECTRO HAND CTR (MISCELLANEOUS) ×4 IMPLANT
SEAL CANN UNIV 5-8 DVNC XI (MISCELLANEOUS) ×9 IMPLANT
SEAL XI 5MM-8MM UNIVERSAL (MISCELLANEOUS) ×3
SET TUBE SMOKE EVAC HIGH FLOW (TUBING) ×4 IMPLANT
SOLUTION ELECTROLUBE (MISCELLANEOUS) ×4 IMPLANT
SUT ETHILON 3-0 FS-10 30 BLK (SUTURE)
SUT MNCRL 4-0 (SUTURE) ×24
SUT MNCRL 4-0 27XMFL (SUTURE) ×18
SUT VIC AB 2-0 SH 27 (SUTURE) ×8
SUT VIC AB 2-0 SH 27XBRD (SUTURE) ×3 IMPLANT
SUT VIC AB 3-0 SH 27 (SUTURE) ×4
SUT VIC AB 3-0 SH 27X BRD (SUTURE) ×3 IMPLANT
SUT VLOC 90 6 CV-15 VIOLET (SUTURE) ×8 IMPLANT
SUTURE EHLN 3-0 FS-10 30 BLK (SUTURE) IMPLANT
SUTURE MNCRL 4-0 27XMF (SUTURE) ×3 IMPLANT
SYR 30ML LL (SYRINGE) ×4 IMPLANT
TAPE TRANSPORE STRL 2 31045 (GAUZE/BANDAGES/DRESSINGS) ×4 IMPLANT
TOWEL OR 17X26 4PK STRL BLUE (TOWEL DISPOSABLE) ×4 IMPLANT
WATER STERILE IRR 500ML POUR (IV SOLUTION) ×4 IMPLANT

## 2022-03-07 ENCOUNTER — Encounter: Payer: Self-pay | Admitting: Surgery

## 2022-03-08 LAB — SURGICAL PATHOLOGY

## 2023-07-31 DIAGNOSIS — F17210 Nicotine dependence, cigarettes, uncomplicated: Secondary | ICD-10-CM | POA: Diagnosis not present

## 2023-07-31 DIAGNOSIS — B850 Pediculosis due to Pediculus humanus capitis: Secondary | ICD-10-CM | POA: Diagnosis not present

## 2023-09-21 DIAGNOSIS — N4889 Other specified disorders of penis: Secondary | ICD-10-CM | POA: Diagnosis not present

## 2023-09-21 DIAGNOSIS — B372 Candidiasis of skin and nail: Secondary | ICD-10-CM | POA: Diagnosis not present

## 2023-10-06 DIAGNOSIS — Z113 Encounter for screening for infections with a predominantly sexual mode of transmission: Secondary | ICD-10-CM | POA: Diagnosis not present

## 2023-10-07 DIAGNOSIS — Z113 Encounter for screening for infections with a predominantly sexual mode of transmission: Secondary | ICD-10-CM | POA: Diagnosis not present

## 2023-11-04 DIAGNOSIS — M25542 Pain in joints of left hand: Secondary | ICD-10-CM | POA: Diagnosis not present

## 2023-11-04 DIAGNOSIS — S61012A Laceration without foreign body of left thumb without damage to nail, initial encounter: Secondary | ICD-10-CM | POA: Diagnosis not present

## 2023-11-11 DIAGNOSIS — S61216D Laceration without foreign body of right little finger without damage to nail, subsequent encounter: Secondary | ICD-10-CM | POA: Diagnosis not present

## 2024-04-30 DIAGNOSIS — R059 Cough, unspecified: Secondary | ICD-10-CM | POA: Diagnosis not present

## 2024-04-30 DIAGNOSIS — U071 COVID-19: Secondary | ICD-10-CM | POA: Diagnosis not present

## 2024-06-10 DIAGNOSIS — Z6825 Body mass index (BMI) 25.0-25.9, adult: Secondary | ICD-10-CM | POA: Diagnosis not present

## 2024-06-10 DIAGNOSIS — R81 Glycosuria: Secondary | ICD-10-CM | POA: Diagnosis not present

## 2024-06-28 DIAGNOSIS — S61411A Laceration without foreign body of right hand, initial encounter: Secondary | ICD-10-CM | POA: Diagnosis not present

## 2024-06-28 DIAGNOSIS — M25541 Pain in joints of right hand: Secondary | ICD-10-CM | POA: Diagnosis not present

## 2024-06-28 DIAGNOSIS — S6991XA Unspecified injury of right wrist, hand and finger(s), initial encounter: Secondary | ICD-10-CM | POA: Diagnosis not present

## 2024-06-28 DIAGNOSIS — F17200 Nicotine dependence, unspecified, uncomplicated: Secondary | ICD-10-CM | POA: Diagnosis not present

## 2024-07-07 DIAGNOSIS — S61411D Laceration without foreign body of right hand, subsequent encounter: Secondary | ICD-10-CM | POA: Diagnosis not present

## 2024-07-07 DIAGNOSIS — Z4802 Encounter for removal of sutures: Secondary | ICD-10-CM | POA: Diagnosis not present

## 2024-08-16 DIAGNOSIS — S39012A Strain of muscle, fascia and tendon of lower back, initial encounter: Secondary | ICD-10-CM | POA: Diagnosis not present
